# Patient Record
Sex: Male | Born: 1980 | Race: White | Hispanic: No | Marital: Married | Smoking: Never smoker
Health system: Southern US, Community
[De-identification: ages and names within clinical notes are randomized; demographics above are authoritative.]

## PROBLEM LIST (undated history)

## (undated) DIAGNOSIS — I10 Essential (primary) hypertension: Secondary | ICD-10-CM

## (undated) DIAGNOSIS — M62830 Muscle spasm of back: Secondary | ICD-10-CM

## (undated) DIAGNOSIS — M5136 Other intervertebral disc degeneration, lumbar region: Secondary | ICD-10-CM

## (undated) DIAGNOSIS — M7918 Myalgia, other site: Secondary | ICD-10-CM

## (undated) DIAGNOSIS — M48061 Spinal stenosis, lumbar region without neurogenic claudication: Secondary | ICD-10-CM

## (undated) DIAGNOSIS — M51369 Other intervertebral disc degeneration, lumbar region without mention of lumbar back pain or lower extremity pain: Secondary | ICD-10-CM

## (undated) DIAGNOSIS — K760 Fatty (change of) liver, not elsewhere classified: Secondary | ICD-10-CM

## (undated) DIAGNOSIS — M545 Low back pain, unspecified: Secondary | ICD-10-CM

## (undated) DIAGNOSIS — R7303 Prediabetes: Secondary | ICD-10-CM

## (undated) DIAGNOSIS — M4310 Spondylolisthesis, site unspecified: Secondary | ICD-10-CM

## (undated) DIAGNOSIS — M792 Neuralgia and neuritis, unspecified: Secondary | ICD-10-CM

## (undated) HISTORY — DX: Other intervertebral disc degeneration, lumbar region without mention of lumbar back pain or lower extremity pain: M51.369

## (undated) HISTORY — DX: Muscle spasm of back: M62.830

## (undated) HISTORY — DX: Low back pain, unspecified: M54.50

## (undated) HISTORY — PX: BACK SURGERY: SHX140

## (undated) HISTORY — DX: Spondylolisthesis, site unspecified: M43.10

## (undated) HISTORY — DX: Spinal stenosis, lumbar region without neurogenic claudication: M48.061

## (undated) HISTORY — DX: Neuralgia and neuritis, unspecified: M79.2

## (undated) HISTORY — DX: Myalgia, other site: M79.18

## (undated) HISTORY — DX: Other intervertebral disc degeneration, lumbar region: M51.36

---

## 2016-03-04 DIAGNOSIS — H66009 Acute suppurative otitis media without spontaneous rupture of ear drum, unspecified ear: Secondary | ICD-10-CM | POA: Diagnosis not present

## 2016-12-03 DIAGNOSIS — H60539 Acute contact otitis externa, unspecified ear: Secondary | ICD-10-CM | POA: Diagnosis not present

## 2016-12-03 DIAGNOSIS — H6123 Impacted cerumen, bilateral: Secondary | ICD-10-CM | POA: Diagnosis not present

## 2016-12-03 DIAGNOSIS — J209 Acute bronchitis, unspecified: Secondary | ICD-10-CM | POA: Diagnosis not present

## 2017-11-11 DIAGNOSIS — M25512 Pain in left shoulder: Secondary | ICD-10-CM | POA: Diagnosis not present

## 2017-11-24 DIAGNOSIS — M25512 Pain in left shoulder: Secondary | ICD-10-CM | POA: Diagnosis not present

## 2017-11-24 DIAGNOSIS — Z6827 Body mass index (BMI) 27.0-27.9, adult: Secondary | ICD-10-CM | POA: Diagnosis not present

## 2017-11-24 DIAGNOSIS — L259 Unspecified contact dermatitis, unspecified cause: Secondary | ICD-10-CM | POA: Diagnosis not present

## 2017-11-24 DIAGNOSIS — Z1339 Encounter for screening examination for other mental health and behavioral disorders: Secondary | ICD-10-CM | POA: Diagnosis not present

## 2017-11-24 DIAGNOSIS — Z1331 Encounter for screening for depression: Secondary | ICD-10-CM | POA: Diagnosis not present

## 2018-03-20 DIAGNOSIS — Z302 Encounter for sterilization: Secondary | ICD-10-CM | POA: Diagnosis not present

## 2018-07-14 DIAGNOSIS — K760 Fatty (change of) liver, not elsewhere classified: Secondary | ICD-10-CM | POA: Diagnosis not present

## 2018-07-14 DIAGNOSIS — Z6828 Body mass index (BMI) 28.0-28.9, adult: Secondary | ICD-10-CM | POA: Diagnosis not present

## 2018-07-14 DIAGNOSIS — Z23 Encounter for immunization: Secondary | ICD-10-CM | POA: Diagnosis not present

## 2018-08-12 DIAGNOSIS — R933 Abnormal findings on diagnostic imaging of other parts of digestive tract: Secondary | ICD-10-CM | POA: Diagnosis not present

## 2018-08-14 DIAGNOSIS — K824 Cholesterolosis of gallbladder: Secondary | ICD-10-CM | POA: Diagnosis not present

## 2018-08-14 DIAGNOSIS — R933 Abnormal findings on diagnostic imaging of other parts of digestive tract: Secondary | ICD-10-CM | POA: Diagnosis not present

## 2018-08-14 DIAGNOSIS — K760 Fatty (change of) liver, not elsewhere classified: Secondary | ICD-10-CM | POA: Diagnosis not present

## 2018-08-14 DIAGNOSIS — R945 Abnormal results of liver function studies: Secondary | ICD-10-CM | POA: Diagnosis not present

## 2018-08-14 DIAGNOSIS — R935 Abnormal findings on diagnostic imaging of other abdominal regions, including retroperitoneum: Secondary | ICD-10-CM | POA: Diagnosis not present

## 2018-09-29 DIAGNOSIS — Z0000 Encounter for general adult medical examination without abnormal findings: Secondary | ICD-10-CM | POA: Diagnosis not present

## 2018-09-29 DIAGNOSIS — Z6828 Body mass index (BMI) 28.0-28.9, adult: Secondary | ICD-10-CM | POA: Diagnosis not present

## 2018-10-03 DIAGNOSIS — R51 Headache: Secondary | ICD-10-CM | POA: Diagnosis not present

## 2018-10-03 DIAGNOSIS — H81399 Other peripheral vertigo, unspecified ear: Secondary | ICD-10-CM | POA: Diagnosis not present

## 2018-10-05 DIAGNOSIS — Z0189 Encounter for other specified special examinations: Secondary | ICD-10-CM | POA: Diagnosis not present

## 2018-10-05 DIAGNOSIS — I10 Essential (primary) hypertension: Secondary | ICD-10-CM | POA: Diagnosis not present

## 2018-10-05 DIAGNOSIS — Z13220 Encounter for screening for lipoid disorders: Secondary | ICD-10-CM | POA: Diagnosis not present

## 2019-01-30 DIAGNOSIS — E860 Dehydration: Secondary | ICD-10-CM | POA: Diagnosis not present

## 2019-01-30 DIAGNOSIS — K529 Noninfective gastroenteritis and colitis, unspecified: Secondary | ICD-10-CM | POA: Diagnosis not present

## 2019-02-01 DIAGNOSIS — K529 Noninfective gastroenteritis and colitis, unspecified: Secondary | ICD-10-CM | POA: Diagnosis not present

## 2019-02-01 DIAGNOSIS — Z6825 Body mass index (BMI) 25.0-25.9, adult: Secondary | ICD-10-CM | POA: Diagnosis not present

## 2019-04-27 DIAGNOSIS — J40 Bronchitis, not specified as acute or chronic: Secondary | ICD-10-CM | POA: Diagnosis not present

## 2019-04-27 DIAGNOSIS — J329 Chronic sinusitis, unspecified: Secondary | ICD-10-CM | POA: Diagnosis not present

## 2019-09-18 DIAGNOSIS — Z20828 Contact with and (suspected) exposure to other viral communicable diseases: Secondary | ICD-10-CM | POA: Diagnosis not present

## 2019-09-30 DIAGNOSIS — Z23 Encounter for immunization: Secondary | ICD-10-CM | POA: Diagnosis not present

## 2019-09-30 DIAGNOSIS — Z1321 Encounter for screening for nutritional disorder: Secondary | ICD-10-CM | POA: Diagnosis not present

## 2019-09-30 DIAGNOSIS — K219 Gastro-esophageal reflux disease without esophagitis: Secondary | ICD-10-CM | POA: Diagnosis not present

## 2019-09-30 DIAGNOSIS — M545 Low back pain: Secondary | ICD-10-CM | POA: Diagnosis not present

## 2019-09-30 DIAGNOSIS — R030 Elevated blood-pressure reading, without diagnosis of hypertension: Secondary | ICD-10-CM | POA: Diagnosis not present

## 2019-12-24 DIAGNOSIS — Z23 Encounter for immunization: Secondary | ICD-10-CM | POA: Diagnosis not present

## 2020-04-06 DIAGNOSIS — M5136 Other intervertebral disc degeneration, lumbar region: Secondary | ICD-10-CM | POA: Diagnosis not present

## 2020-04-12 DIAGNOSIS — Z5181 Encounter for therapeutic drug level monitoring: Secondary | ICD-10-CM | POA: Diagnosis not present

## 2020-11-21 DIAGNOSIS — S63502A Unspecified sprain of left wrist, initial encounter: Secondary | ICD-10-CM | POA: Diagnosis not present

## 2021-01-30 DIAGNOSIS — M79642 Pain in left hand: Secondary | ICD-10-CM | POA: Diagnosis not present

## 2021-01-30 DIAGNOSIS — M654 Radial styloid tenosynovitis [de Quervain]: Secondary | ICD-10-CM | POA: Diagnosis not present

## 2021-01-30 DIAGNOSIS — M778 Other enthesopathies, not elsewhere classified: Secondary | ICD-10-CM | POA: Diagnosis not present

## 2021-02-05 DIAGNOSIS — M545 Low back pain, unspecified: Secondary | ICD-10-CM | POA: Diagnosis not present

## 2021-02-05 DIAGNOSIS — R739 Hyperglycemia, unspecified: Secondary | ICD-10-CM | POA: Diagnosis not present

## 2021-02-05 DIAGNOSIS — G8929 Other chronic pain: Secondary | ICD-10-CM | POA: Diagnosis not present

## 2021-02-05 DIAGNOSIS — I10 Essential (primary) hypertension: Secondary | ICD-10-CM | POA: Diagnosis not present

## 2021-02-05 DIAGNOSIS — Z01810 Encounter for preprocedural cardiovascular examination: Secondary | ICD-10-CM | POA: Diagnosis not present

## 2021-02-15 DIAGNOSIS — I10 Essential (primary) hypertension: Secondary | ICD-10-CM | POA: Diagnosis not present

## 2021-03-01 DIAGNOSIS — S161XXA Strain of muscle, fascia and tendon at neck level, initial encounter: Secondary | ICD-10-CM | POA: Diagnosis not present

## 2021-04-11 DIAGNOSIS — K760 Fatty (change of) liver, not elsewhere classified: Secondary | ICD-10-CM | POA: Diagnosis not present

## 2021-04-11 DIAGNOSIS — E119 Type 2 diabetes mellitus without complications: Secondary | ICD-10-CM | POA: Diagnosis not present

## 2021-05-23 DIAGNOSIS — E785 Hyperlipidemia, unspecified: Secondary | ICD-10-CM | POA: Diagnosis not present

## 2021-05-25 ENCOUNTER — Ambulatory Visit: Payer: Self-pay | Admitting: Orthopedic Surgery

## 2021-05-28 ENCOUNTER — Other Ambulatory Visit: Payer: Self-pay

## 2021-06-11 NOTE — Progress Notes (Signed)
[facility] [place]  ASSESSMENT / PLAN: Andres West is a 40 y.o. male with plans to undergo L5/S1 anterior lumbar interbody fusion on 06/21/21 with Dr. Shon Baton. I have reviewed the patient's imaging and feel it is safe to approach this anteriorly. I discussed the specific risks associated with spine exposure including vascular injury, urethral injury, bowel injury, nervous injury. I explained that if severe vascular injury was encountered we may need to abandon the spinal procedure to safely repair the injury. I explained the typical postoperative course for anterior exposure of the spine, including small risk of postoperative ileus. The patient was understanding and wished to proceed.   CHIEF COMPLAINT: back pain  HISTORY OF PRESENT ILLNESS: Andres West is a 40 y.o. male who is otherwise healthy who presents for preoperative evaluation for ALIF.  He reports significant back pain which is caused him that he over the past several months.  He was seen by Dr. Shon Baton and offered L5-S1 ALIF.  The patient has no past surgical history.  He has never had a significant injury to his abdomen or pelvis.  He has never had radiotherapy to his abdomen or pelvis.  Past Medical History:  Diagnosis Date   Back spasm    Buttock pain    Degenerative spondylolisthesis    Foraminal stenosis of lumbar region    Moderate   Low back pain    Neuropathic pain, leg    Intermittent   Other intervertebral disc degeneration, lumbar region     Past Surgical History:  Procedure Laterality Date   BACK SURGERY N/A     History reviewed. No pertinent family history.  Social History   Socioeconomic History   Marital status: Married    Spouse name: Not on file   Number of children: Not on file   Years of education: Not on file   Highest education level: Not on file  Occupational History   Not on file  Tobacco Use   Smoking status: Never   Smokeless tobacco: Never  Vaping Use   Vaping  Use: Never used  Substance and Sexual Activity   Alcohol use: Not Currently   Drug use: Never   Sexual activity: Not on file  Other Topics Concern   Not on file  Social History Narrative   Not on file   Social Determinants of Health   Financial Resource Strain: Not on file  Food Insecurity: Not on file  Transportation Needs: Not on file  Physical Activity: Not on file  Stress: Not on file  Social Connections: Not on file  Intimate Partner Violence: Not on file    No Known Allergies  Current Outpatient Medications  Medication Sig Dispense Refill   lisinopril (ZESTRIL) 10 MG tablet Take 10 mg by mouth daily.     OMEPRAZOLE PO Take 1 capsule by mouth daily.     atorvastatin (LIPITOR) 40 MG tablet TAKE ONE TABLET BY MOUTH AT BEDTIME FOR CHOLESTEROL (Patient not taking: Reported on 06/12/2021)     baclofen (LIORESAL) 10 MG tablet TAKE ONE-HALF TABLET BY MOUTH TWICE A DAY AS NEEDED (Patient not taking: Reported on 06/12/2021)     No current facility-administered medications for this visit.    REVIEW OF SYSTEMS:  [X]  denotes positive finding, [ ]  denotes negative finding Cardiac  Comments:  Chest pain or chest pressure:    Shortness of breath upon exertion:    Short of breath when lying flat:    Irregular heart rhythm:  Vascular    Pain in calf, thigh, or hip brought on by ambulation:    Pain in feet at night that wakes you up from your sleep:     Blood clot in your veins:    Leg swelling:         Pulmonary    Oxygen at home:    Productive cough:     Wheezing:         Neurologic    Sudden weakness in arms or legs:     Sudden numbness in arms or legs:     Sudden onset of difficulty speaking or slurred speech:    Temporary loss of vision in one eye:     Problems with dizziness:         Gastrointestinal    Blood in stool:     Vomited blood:         Genitourinary    Burning when urinating:     Blood in urine:        Psychiatric    Major depression:          Hematologic    Bleeding problems:    Problems with blood clotting too easily:        Skin    Rashes or ulcers:        Constitutional    Fever or chills:      PHYSICAL EXAM Vitals:   06/12/21 1619  BP: 124/83  Pulse: 100  Resp: 20  Temp: 98.6 F (37 C)  SpO2: 97%  Weight: 206 lb 12.8 oz (93.8 kg)  Height: 6' (1.829 m)    Constitutional: well appearing. no distress. Appears well nourished.  Neurologic: CN intact. no focal findings. no sensory loss. Psychiatric:  Mood and affect symmetric and appropriate. Eyes:  No icterus. No conjunctival pallor. Ears, nose, throat:  mucous membranes moist. Midline trachea.  Cardiac:  rate and rhythm.  Respiratory:  unlabored. Abdominal:  soft, non-tender, non-distended.  Extremity: no edema. no cyanosis. no pallor.  Skin: no gangrene. no ulceration.  Lymphatic: no Stemmer's sign. no palpable lymphadenopathy.  PERTINENT LABORATORY AND RADIOLOGIC DATA MRI lumbar spine personally reviewed.  Venous structures appear to bifurcate around the top of the L5 vertebral body.  No significant vascular structures overlying the L5/S1 disc space.  Rande Brunt. Lenell Antu, MD [facility] Phone Number: [phone] 06/12/2021 5:35 PM  Total time spent on preparing this encounter including chart review, data review, collecting history, examining the patient, coordinating care for this new patient, 45 minutes.  Portions of this report may have been transcribed using voice recognition software.  Every effort has been made to ensure accuracy; however, inadvertent computerized transcription errors may still be present.

## 2021-06-11 NOTE — H&P (View-Only) (Signed)
[facility] [place]  ASSESSMENT / PLAN: Andres West is a 40 y.o. male with plans to undergo L5/S1 anterior lumbar interbody fusion on 06/21/21 with Dr. Shon Baton. I have reviewed the patient's imaging and feel it is safe to approach this anteriorly. I discussed the specific risks associated with spine exposure including vascular injury, urethral injury, bowel injury, nervous injury. I explained that if severe vascular injury was encountered we may need to abandon the spinal procedure to safely repair the injury. I explained the typical postoperative course for anterior exposure of the spine, including small risk of postoperative ileus. The patient was understanding and wished to proceed.   CHIEF COMPLAINT: back pain  HISTORY OF PRESENT ILLNESS: Andres West is a 40 y.o. male who is otherwise healthy who presents for preoperative evaluation for ALIF.  He reports significant back pain which is caused him that he over the past several months.  He was seen by Dr. Shon Baton and offered L5-S1 ALIF.  The patient has no past surgical history.  He has never had a significant injury to his abdomen or pelvis.  He has never had radiotherapy to his abdomen or pelvis.  Past Medical History:  Diagnosis Date   Back spasm    Buttock pain    Degenerative spondylolisthesis    Foraminal stenosis of lumbar region    Moderate   Low back pain    Neuropathic pain, leg    Intermittent   Other intervertebral disc degeneration, lumbar region     Past Surgical History:  Procedure Laterality Date   BACK SURGERY N/A     History reviewed. No pertinent family history.  Social History   Socioeconomic History   Marital status: Married    Spouse name: Not on file   Number of children: Not on file   Years of education: Not on file   Highest education level: Not on file  Occupational History   Not on file  Tobacco Use   Smoking status: Never   Smokeless tobacco: Never  Vaping Use   Vaping  Use: Never used  Substance and Sexual Activity   Alcohol use: Not Currently   Drug use: Never   Sexual activity: Not on file  Other Topics Concern   Not on file  Social History Narrative   Not on file   Social Determinants of Health   Financial Resource Strain: Not on file  Food Insecurity: Not on file  Transportation Needs: Not on file  Physical Activity: Not on file  Stress: Not on file  Social Connections: Not on file  Intimate Partner Violence: Not on file    No Known Allergies  Current Outpatient Medications  Medication Sig Dispense Refill   lisinopril (ZESTRIL) 10 MG tablet Take 10 mg by mouth daily.     OMEPRAZOLE PO Take 1 capsule by mouth daily.     atorvastatin (LIPITOR) 40 MG tablet TAKE ONE TABLET BY MOUTH AT BEDTIME FOR CHOLESTEROL (Patient not taking: Reported on 06/12/2021)     baclofen (LIORESAL) 10 MG tablet TAKE ONE-HALF TABLET BY MOUTH TWICE A DAY AS NEEDED (Patient not taking: Reported on 06/12/2021)     No current facility-administered medications for this visit.    REVIEW OF SYSTEMS:  [X]  denotes positive finding, [ ]  denotes negative finding Cardiac  Comments:  Chest pain or chest pressure:    Shortness of breath upon exertion:    Short of breath when lying flat:    Irregular heart rhythm:  Vascular    Pain in calf, thigh, or hip brought on by ambulation:    Pain in feet at night that wakes you up from your sleep:     Blood clot in your veins:    Leg swelling:         Pulmonary    Oxygen at home:    Productive cough:     Wheezing:         Neurologic    Sudden weakness in arms or legs:     Sudden numbness in arms or legs:     Sudden onset of difficulty speaking or slurred speech:    Temporary loss of vision in one eye:     Problems with dizziness:         Gastrointestinal    Blood in stool:     Vomited blood:         Genitourinary    Burning when urinating:     Blood in urine:        Psychiatric    Major depression:          Hematologic    Bleeding problems:    Problems with blood clotting too easily:        Skin    Rashes or ulcers:        Constitutional    Fever or chills:      PHYSICAL EXAM Vitals:   06/12/21 1619  BP: 124/83  Pulse: 100  Resp: 20  Temp: 98.6 F (37 C)  SpO2: 97%  Weight: 206 lb 12.8 oz (93.8 kg)  Height: 6' (1.829 m)    Constitutional: well appearing. no distress. Appears well nourished.  Neurologic: CN intact. no focal findings. no sensory loss. Psychiatric:  Mood and affect symmetric and appropriate. Eyes:  No icterus. No conjunctival pallor. Ears, nose, throat:  mucous membranes moist. Midline trachea.  Cardiac:  rate and rhythm.  Respiratory:  unlabored. Abdominal:  soft, non-tender, non-distended.  Extremity: no edema. no cyanosis. no pallor.  Skin: no gangrene. no ulceration.  Lymphatic: no Stemmer's sign. no palpable lymphadenopathy.  PERTINENT LABORATORY AND RADIOLOGIC DATA MRI lumbar spine personally reviewed.  Venous structures appear to bifurcate around the top of the L5 vertebral body.  No significant vascular structures overlying the L5/S1 disc space.  Mohmmad Saleeby N. Alda Gaultney, MD [facility] [place] Office Phone Number: [phone] 06/12/2021 5:35 PM  Total time spent on preparing this encounter including chart review, data review, collecting history, examining the patient, coordinating care for this new patient, 45 minutes.  Portions of this report may have been transcribed using voice recognition software.  Every effort has been made to ensure accuracy; however, inadvertent computerized transcription errors may still be present.    

## 2021-06-12 ENCOUNTER — Encounter: Payer: Self-pay | Admitting: Vascular Surgery

## 2021-06-12 ENCOUNTER — Ambulatory Visit (INDEPENDENT_AMBULATORY_CARE_PROVIDER_SITE_OTHER): Payer: No Typology Code available for payment source | Admitting: Vascular Surgery

## 2021-06-12 ENCOUNTER — Ambulatory Visit: Payer: Self-pay | Admitting: Orthopedic Surgery

## 2021-06-12 ENCOUNTER — Other Ambulatory Visit: Payer: Self-pay

## 2021-06-12 VITALS — BP 124/83 | HR 100 | Temp 98.6°F | Resp 20 | Ht 72.0 in | Wt 206.8 lb

## 2021-06-12 DIAGNOSIS — M545 Low back pain, unspecified: Secondary | ICD-10-CM | POA: Diagnosis not present

## 2021-06-12 NOTE — H&P (Signed)
Subjective:   Andres West is a very pleasant 40 year old gentleman with significant progressive back buttock and neuropathic leg pain right worse than left. He has had to full rounds of physical therapy as well as facet blocks and medial branch blocks without any significant relief. We have discussed surgical intervention, risks and benefits and alternatives. Patient has elected to move forward with surgical intervention. He is scheduled for ALIF L5-S1 w/ PSFI on 06/21/2021 with Dr. Shon Baton at [facility].  Past Medical History:  Diagnosis Date   Back spasm    Buttock pain    Degenerative spondylolisthesis    Foraminal stenosis of lumbar region    Moderate   Low back pain    Neuropathic pain, leg    Intermittent   Other intervertebral disc degeneration, lumbar region      Current Outpatient Medications  Medication Sig Dispense Refill Last Dose   lisinopril (ZESTRIL) 10 MG tablet Take 10 mg by mouth daily.      OMEPRAZOLE PO Take 1 capsule by mouth daily.      No current facility-administered medications for this visit.   No Known Allergies  Social History   Tobacco Use   Smoking status: Not on file   Smokeless tobacco: Not on file  Substance Use Topics   Alcohol use: Not on file    No family history on file.  Review of Systems Pertinent items are noted in HPI.  Objective:   Vitals: Ht: 6 ft 06/12/2021 01:23 pm Wt: 204.5 lbs 06/12/2021 01:23 pm BMI: 27.7 06/12/2021 01:23 pm BP: 118/88 06/12/2021 01:25 pm O2Sat: 99% 06/12/2021 01:25 pm  General: Alert and oriented 3, no apparent distress  Ambulation normal. No assistive devices  Heart: Regular rate and rhythm, no rubs, murmurs, or gallops  Lungs clear to auscultation bilaterally  Abdomen: Bowel sounds 4. Nondistended, nontender, no rebound tenderness. No loss of bladder or bowel control.  He continues to have severe back buttock and intermittent neuropathic leg pain. There is been no change in his clinical exam since  his last office visit of 01/19/21. Lumbar MRI: completed on 02/14/21 was reviewed with the patient. It was completed at [facility]; I have independently reviewed the images as well as the radiology report. L5-S1 retrolisthesis. Severe bilateral L5 and S1 nerve root compression. Moderate foraminal stenosis. Advanced degenerative disc disease noted at this level. Mild to minimal disc degeneration L1-5. No abnormal fracture or suspicious marrow signal changes. There is a rudimentary disc noted as S1-S2.  Assessment:   [name] is a very pleasant 40 year old gentleman with significant progressive back buttock and neuropathic leg pain. At this point time the MRI shows no significant change and so I think it is reasonable to move forward with the planned anterior lumbar interbody fusion L5-S1 with posterior pedicle screw fixation. I have gone over the surgical procedure as well as the images with the patient.  Risks and benefits of surgery were discussed with the patient. These include: Infection, bleeding, death, stroke, paralysis, ongoing or worse pain, need for additional surgery, nonunion, leak of spinal fluid, adjacent segment degeneration requiring additional fusion surgery, need for posterior decompression and/or fusion. Bleeding from major vessels, and blood clots (deep venous thrombosis)requiring additional treatment. Due to the abdominal contents requiring further intervention, loss in bowel and bladder control. Additional risk for male patients: Retrograde ejaculation, and therefore infertility.  Risks and benefits of surgery were discussed with the patient. These include: Infection, bleeding, death, stroke, paralysis, ongoing or worse pain, need for additional surgery, nonunion,  leak of spinal fluid, adjacent segment degeneration requiring additional fusion surgery, Injury to abdominal vessels that can require anterior surgery to stop bleeding. Malposition of the cage and/or pedicle screws that could  require additional surgery. Loss of bowel and bladder control. Postoperative hematoma causing neurologic compression that could require urgent or emergent re-operation.    Plan:   Anterior lumbar interbody fusion at L5-S1 posterior spinal fusion instrumentation  We will obtain preoperative medical clearance for the patient's primary care provider  Patient is scheduled to meet with vascular surgeon tomorrow  Patient scheduled for LSO brace fitting  Patient scheduled for preop testing at [facility]  We have also discussed the post-operative recovery period to include: bathing/showering restrictions, wound healing, activity (and driving) restrictions, medications/pain mangement.  We have also discussed post-operative redflags to include: signs and symptoms of postoperative infection, DVT/PE.  Patient given a copy of the discharge instructions at today's office visit. These were reviewed with him. All of his questions were invited and answered  Follow-up: 2 weeks postop

## 2021-06-12 NOTE — Pre-Procedure Instructions (Signed)
Surgical Instructions   Your procedure is scheduled on Thursday, September 8th. Report to [facility] Main Entrance "A" at 05:30 A.M., then check in with the Admitting office. Call this number if you have problems the morning of surgery: [phone]   If you have any questions prior to your surgery date call [phone]: Open Monday-Friday 8am-4pm   Remember: Do not eat after midnight the night before your surgery  You may drink clear liquids until 04:30 AM the morning of your surgery.   Clear liquids allowed are: Water, Non-Citrus Juices (without pulp), Carbonated Beverages, Clear Tea, Black Coffee ONLY (NO MILK, CREAM OR POWDERED CREAMER of any kind), and Gatorade   Take these medicines the morning of surgery with A SIP OF WATER  OMEPRAZOLE    As of today, STOP taking any Aspirin (unless otherwise instructed by your surgeon) Aleve, Naproxen, Ibuprofen, Motrin, Advil, Goody's, BC's, all herbal medications, fish oil, and all vitamins.          Do not wear jewelry  Do not wear lotions, powders, colognes, or deodorant. Men may shave face and neck. Do not bring valuables to the hospital. [facility] is not responsible for any belongings or valuables.               Do NOT Smoke (Tobacco/Vaping) or drink Alcohol 24 hours prior to your procedure If you use a CPAP at night, you may bring all equipment for your overnight stay.   Contacts, glasses, dentures or bridgework may not be worn into surgery, please bring cases for these belongings   For patients admitted to the hospital, discharge time will be determined by your treatment team.   Patients discharged the day of surgery will not be allowed to drive home, and someone needs to stay with them for 24 hours.  ONLY 1 SUPPORT PERSON MAY BE PRESENT WHILE YOU ARE IN SURGERY. IF YOU ARE TO BE ADMITTED ONCE YOU ARE IN YOUR ROOM YOU WILL BE ALLOWED TWO (2) VISITORS.  Minor children may have two parents present. Special consideration for  safety and communication needs will be reviewed on a case by case basis.  Special instructions:    Oral Hygiene is also important to reduce your risk of infection.  Remember - BRUSH YOUR TEETH THE MORNING OF SURGERY WITH YOUR REGULAR TOOTHPASTE   [place]- Preparing For Surgery  Before surgery, you can play an important role. Because skin is not sterile, your skin needs to be as free of germs as possible. You can reduce the number of germs on your skin by washing with CHG (chlorahexidine gluconate) Soap before surgery.  CHG is an antiseptic cleaner which kills germs and bonds with the skin to continue killing germs even after washing.     Please do not use if you have an allergy to CHG or antibacterial soaps. If your skin becomes reddened/irritated stop using the CHG.  Do not shave (including legs and underarms) for at least 48 hours prior to first CHG shower. It is OK to shave your face.  Please follow these instructions carefully.     Shower the NIGHT BEFORE SURGERY and the MORNING OF SURGERY with CHG Soap.   If you chose to wash your hair, wash your hair first as usual with your normal shampoo. After you shampoo, rinse your hair and body thoroughly to remove the shampoo.  Then [employer] and genitals (private parts) with your normal soap and rinse thoroughly to remove soap.  After that Use CHG  Soap as you would any other liquid soap. You can apply CHG directly to the skin and wash gently with a scrungie or a clean washcloth.   Apply the CHG Soap to your body ONLY FROM THE NECK DOWN.  Do not use on open wounds or open sores. Avoid contact with your eyes, ears, mouth and genitals (private parts). Wash Face and genitals (private parts)  with your normal soap.   Wash thoroughly, paying special attention to the area where your surgery will be performed.  Thoroughly rinse your body with warm water from the neck down.  DO NOT shower/wash with your normal soap after using and rinsing off  the CHG Soap.  Pat yourself dry with a CLEAN TOWEL.  Wear CLEAN PAJAMAS to bed the night before surgery  Place CLEAN SHEETS on your bed the night before your surgery  DO NOT SLEEP WITH PETS.   Day of Surgery:  Take a shower with CHG soap. Wear Clean/Comfortable clothing the morning of surgery Do not apply any deodorants/lotions.   Remember to brush your teeth WITH YOUR REGULAR TOOTHPASTE.   Please read over the following fact sheets that you were given.

## 2021-06-13 ENCOUNTER — Other Ambulatory Visit: Payer: Self-pay

## 2021-06-13 ENCOUNTER — Encounter (HOSPITAL_COMMUNITY): Payer: Self-pay

## 2021-06-13 ENCOUNTER — Encounter (HOSPITAL_COMMUNITY)
Admission: RE | Admit: 2021-06-13 | Discharge: 2021-06-13 | Disposition: A | Discharge status: Home / Self Care | Payer: No Typology Code available for payment source | Source: Ambulatory Visit | Attending: Orthopedic Surgery | Admitting: Orthopedic Surgery

## 2021-06-13 DIAGNOSIS — Z01818 Encounter for other preprocedural examination: Secondary | ICD-10-CM | POA: Diagnosis present

## 2021-06-13 HISTORY — DX: Essential (primary) hypertension: I10

## 2021-06-13 HISTORY — DX: Prediabetes: R73.03

## 2021-06-13 HISTORY — DX: Fatty (change of) liver, not elsewhere classified: K76.0

## 2021-06-13 LAB — CBC
HCT: 47.6 % (ref 39.0–52.0)
Hemoglobin: 16.1 g/dL (ref 13.0–17.0)
MCH: 30.3 pg (ref 26.0–34.0)
MCHC: 33.8 g/dL (ref 30.0–36.0)
MCV: 89.5 fL (ref 80.0–100.0)
Platelets: 153 10*3/uL (ref 150–400)
RBC: 5.32 MIL/uL (ref 4.22–5.81)
RDW: 12.2 % (ref 11.5–15.5)
WBC: 5.9 10*3/uL (ref 4.0–10.5)
nRBC: 0 % (ref 0.0–0.2)

## 2021-06-13 LAB — URINALYSIS, ROUTINE W REFLEX MICROSCOPIC
Bacteria, UA: NONE SEEN
Bilirubin Urine: NEGATIVE
Glucose, UA: NEGATIVE mg/dL
Ketones, ur: NEGATIVE mg/dL
Leukocytes,Ua: NEGATIVE
Nitrite: NEGATIVE
Protein, ur: NEGATIVE mg/dL
Specific Gravity, Urine: 1.006 (ref 1.005–1.030)
pH: 6 (ref 5.0–8.0)

## 2021-06-13 LAB — BASIC METABOLIC PANEL
Anion gap: 7 (ref 5–15)
BUN: 12 mg/dL (ref 6–20)
CO2: 28 mmol/L (ref 22–32)
Calcium: 9.5 mg/dL (ref 8.9–10.3)
Chloride: 101 mmol/L (ref 98–111)
Creatinine, Ser: 1.1 mg/dL (ref 0.61–1.24)
GFR, Estimated: >60 mL/min (ref >60)
Glucose, Bld: 110 mg/dL — ABNORMAL HIGH (ref 70–99)
Potassium: 3.9 mmol/L (ref 3.5–5.1)
Sodium: 136 mmol/L (ref 135–145)

## 2021-06-13 LAB — PROTIME-INR
INR: 1 (ref 0.8–1.2)
Prothrombin Time: 13.6 seconds (ref 11.4–15.2)

## 2021-06-13 LAB — TYPE AND SCREEN
ABO/RH(D): A POS
Antibody Screen: NEGATIVE

## 2021-06-13 LAB — GLUCOSE, CAPILLARY: Glucose-Capillary: 120 mg/dL — ABNORMAL HIGH (ref 70–99)

## 2021-06-13 LAB — APTT: aPTT: 29 seconds (ref 24–36)

## 2021-06-13 LAB — SURGICAL PCR SCREEN
MRSA, PCR: NEGATIVE
Staphylococcus aureus: NEGATIVE

## 2021-06-13 NOTE — Progress Notes (Signed)
[facility] - [facility], Dr- Su Hilt Cardiologist - denies  PPM/ICD - denies   Chest x-ray -  EKG - 06/13/21 Stress Test - denies ECHO - denies Cardiac Cath - denies  Sleep Study - denies   Does not check CBG at home  Patient instructed to hold all Aspirin, NSAID's, herbal medications, fish oil and vitamins 7 days prior to surgery.   ERAS Protcol -yes   COVID TEST- scheduled for 06/19/21  Anesthesia review: per order  Patient denies shortness of breath, fever, cough and chest pain at PAT appointment   All instructions explained to the patient, with a verbal understanding of the material. Patient agrees to go over the instructions while at home for a better understanding. Patient also instructed to self quarantine after being tested for COVID-19. The opportunity to ask questions was provided.

## 2021-06-19 ENCOUNTER — Other Ambulatory Visit: Payer: Self-pay

## 2021-06-19 ENCOUNTER — Other Ambulatory Visit (HOSPITAL_COMMUNITY)
Admission: RE | Admit: 2021-06-19 | Discharge: 2021-06-19 | Disposition: A | Discharge status: Home / Self Care | Payer: No Typology Code available for payment source | Source: Ambulatory Visit | Attending: Orthopedic Surgery | Admitting: Orthopedic Surgery

## 2021-06-19 DIAGNOSIS — Z20822 Contact with and (suspected) exposure to covid-19: Secondary | ICD-10-CM | POA: Insufficient documentation

## 2021-06-19 DIAGNOSIS — Z01812 Encounter for preprocedural laboratory examination: Secondary | ICD-10-CM | POA: Insufficient documentation

## 2021-06-20 LAB — SARS CORONAVIRUS 2 (TAT 6-24 HRS): SARS Coronavirus 2: NEGATIVE

## 2021-06-20 NOTE — Anesthesia Preprocedure Evaluation (Addendum)
Anesthesia Evaluation  Patient identified by MRN, date of birth, ID band Patient awake    Reviewed: Allergy & Precautions, NPO status , Patient's Chart, lab work & pertinent test results  Airway Mallampati: II  TM Distance: >3 FB Neck ROM: Full    Dental  (+) Dental Advisory Given, Chipped,    Pulmonary neg pulmonary ROS,    Pulmonary exam normal breath sounds clear to auscultation       Cardiovascular hypertension, Pt. on medications Normal cardiovascular exam Rhythm:Regular Rate:Normal     Neuro/Psych negative neurological ROS  negative psych ROS   GI/Hepatic Neg liver ROS, GERD  Medicated and Controlled,  Endo/Other  negative endocrine ROS  Renal/GU negative Renal ROS  negative genitourinary   Musculoskeletal  (+) Arthritis ,   Abdominal   Peds  Hematology negative hematology ROS (+)   Anesthesia Other Findings   Reproductive/Obstetrics                            Anesthesia Physical Anesthesia Plan  ASA: 2  Anesthesia Plan: General and Regional   Post-op Pain Management:  Regional for Post-op pain   Induction: Intravenous  PONV Risk Score and Plan: Midazolam, Dexamethasone and Ondansetron  Airway Management Planned: Oral ETT  Additional Equipment:   Intra-op Plan:   Post-operative Plan: Extubation in OR  Informed Consent: I have reviewed the patients History and Physical, chart, labs and discussed the procedure including the risks, benefits and alternatives for the proposed anesthesia with the patient or authorized representative who has indicated his/her understanding and acceptance.     Dental advisory given  Plan Discussed with: CRNA  Anesthesia Plan Comments: (2 IVs)        Anesthesia Quick Evaluation

## 2021-06-21 ENCOUNTER — Other Ambulatory Visit: Payer: Self-pay

## 2021-06-21 ENCOUNTER — Inpatient Hospital Stay (HOSPITAL_COMMUNITY): Payer: No Typology Code available for payment source

## 2021-06-21 ENCOUNTER — Encounter (HOSPITAL_COMMUNITY)
Admission: RE | Disposition: A | Discharge status: Home / Self Care | Payer: Self-pay | Source: Home / Self Care | Attending: Orthopedic Surgery

## 2021-06-21 ENCOUNTER — Inpatient Hospital Stay (HOSPITAL_COMMUNITY): Payer: No Typology Code available for payment source | Admitting: Certified Registered"

## 2021-06-21 ENCOUNTER — Encounter (HOSPITAL_COMMUNITY): Payer: Self-pay | Admitting: Orthopedic Surgery

## 2021-06-21 ENCOUNTER — Inpatient Hospital Stay (HOSPITAL_COMMUNITY)
Admission: RE | Admit: 2021-06-21 | Discharge: 2021-06-23 | DRG: 460 | Disposition: A | Discharge status: Home / Self Care | Payer: No Typology Code available for payment source | Attending: Orthopedic Surgery | Admitting: Orthopedic Surgery

## 2021-06-21 DIAGNOSIS — R7303 Prediabetes: Secondary | ICD-10-CM | POA: Diagnosis present

## 2021-06-21 DIAGNOSIS — Z79899 Other long term (current) drug therapy: Secondary | ICD-10-CM | POA: Diagnosis not present

## 2021-06-21 DIAGNOSIS — M5137 Other intervertebral disc degeneration, lumbosacral region: Secondary | ICD-10-CM | POA: Diagnosis present

## 2021-06-21 DIAGNOSIS — I10 Essential (primary) hypertension: Secondary | ICD-10-CM | POA: Diagnosis present

## 2021-06-21 DIAGNOSIS — M5117 Intervertebral disc disorders with radiculopathy, lumbosacral region: Principal | ICD-10-CM | POA: Diagnosis present

## 2021-06-21 DIAGNOSIS — Z20822 Contact with and (suspected) exposure to covid-19: Secondary | ICD-10-CM | POA: Diagnosis present

## 2021-06-21 DIAGNOSIS — K760 Fatty (change of) liver, not elsewhere classified: Secondary | ICD-10-CM | POA: Diagnosis present

## 2021-06-21 DIAGNOSIS — Z419 Encounter for procedure for purposes other than remedying health state, unspecified: Secondary | ICD-10-CM

## 2021-06-21 DIAGNOSIS — Z9889 Other specified postprocedural states: Secondary | ICD-10-CM | POA: Diagnosis not present

## 2021-06-21 DIAGNOSIS — Z981 Arthrodesis status: Secondary | ICD-10-CM

## 2021-06-21 HISTORY — PX: ANTERIOR AND POSTERIOR SPINAL FUSION: SHX2259

## 2021-06-21 HISTORY — PX: ABDOMINAL EXPOSURE: SHX5708

## 2021-06-21 LAB — CBC
HCT: 40.4 % (ref 39.0–52.0)
Hemoglobin: 14.3 g/dL (ref 13.0–17.0)
MCH: 31.2 pg (ref 26.0–34.0)
MCHC: 35.4 g/dL (ref 30.0–36.0)
MCV: 88 fL (ref 80.0–100.0)
Platelets: 115 10*3/uL — ABNORMAL LOW (ref 150–400)
RBC: 4.59 MIL/uL (ref 4.22–5.81)
RDW: 11.9 % (ref 11.5–15.5)
WBC: 11.7 10*3/uL — ABNORMAL HIGH (ref 4.0–10.5)
nRBC: 0 % (ref 0.0–0.2)

## 2021-06-21 LAB — ABO/RH: ABO/RH(D): A POS

## 2021-06-21 LAB — CREATININE, SERUM
Creatinine, Ser: 1.12 mg/dL (ref 0.61–1.24)
GFR, Estimated: >60 mL/min (ref >60)

## 2021-06-21 LAB — GLUCOSE, CAPILLARY: Glucose-Capillary: 123 mg/dL — ABNORMAL HIGH (ref 70–99)

## 2021-06-21 SURGERY — ANTERIOR AND POSTERIOR SPINAL FUSION
Anesthesia: Regional | Site: Back

## 2021-06-21 MED ORDER — DOCUSATE SODIUM 100 MG PO CAPS
100.0000 mg | ORAL_CAPSULE | Freq: Two times a day (BID) | ORAL | Status: DC
  Administered 2021-06-21 – 2021-06-23 (×3): 100 mg via ORAL
  Filled 2021-06-21 (×3): qty 1

## 2021-06-21 MED ORDER — ONDANSETRON HCL 4 MG/2ML IJ SOLN
4.0000 mg | Freq: Four times a day (QID) | INTRAMUSCULAR | Status: DC | PRN
  Administered 2021-06-21 (×2): 4 mg via INTRAVENOUS
  Filled 2021-06-21: qty 2

## 2021-06-21 MED ORDER — HEMOSTATIC AGENTS (NO CHARGE) OPTIME
TOPICAL | Status: DC | PRN
  Administered 2021-06-21: 1 via TOPICAL

## 2021-06-21 MED ORDER — LACTATED RINGERS IV SOLN: INTRAVENOUS | Status: DC

## 2021-06-21 MED ORDER — ACETAMINOPHEN 500 MG PO TABS
1000.0000 mg | ORAL_TABLET | Freq: Once | ORAL | Status: AC
  Administered 2021-06-21: 1000 mg via ORAL
  Filled 2021-06-21: qty 2

## 2021-06-21 MED ORDER — METHOCARBAMOL 500 MG PO TABS: 500.0000 mg | ORAL_TABLET | Freq: Three times a day (TID) | ORAL | 0 refills | Status: AC | PRN

## 2021-06-21 MED ORDER — THROMBIN 20000 UNITS EX SOLR
CUTANEOUS | Status: AC
  Filled 2021-06-21: qty 20000

## 2021-06-21 MED ORDER — ROCURONIUM BROMIDE 10 MG/ML (PF) SYRINGE
PREFILLED_SYRINGE | INTRAVENOUS | Status: DC | PRN
Start: 2021-06-21 — End: 2021-06-21
  Administered 2021-06-21 (×2): 10 mg via INTRAVENOUS
  Administered 2021-06-21: 50 mg via INTRAVENOUS
  Administered 2021-06-21 (×3): 10 mg via INTRAVENOUS

## 2021-06-21 MED ORDER — BUPIVACAINE-EPINEPHRINE 0.25% -1:200000 IJ SOLN
INTRAMUSCULAR | Status: DC | PRN
  Administered 2021-06-21: 20 mL

## 2021-06-21 MED ORDER — ACETAMINOPHEN 650 MG RE SUPP: 650.0000 mg | RECTAL | Status: DC | PRN

## 2021-06-21 MED ORDER — PROPOFOL 10 MG/ML IV BOLUS
INTRAVENOUS | Status: AC
  Filled 2021-06-21: qty 20

## 2021-06-21 MED ORDER — ONDANSETRON HCL 4 MG/2ML IJ SOLN
INTRAMUSCULAR | Status: DC | PRN
  Administered 2021-06-21: 4 mg via INTRAVENOUS

## 2021-06-21 MED ORDER — ONDANSETRON HCL 4 MG PO TABS
4.0000 mg | ORAL_TABLET | Freq: Four times a day (QID) | ORAL | Status: DC | PRN
  Filled 2021-06-21: qty 1

## 2021-06-21 MED ORDER — MIDAZOLAM HCL 2 MG/2ML IJ SOLN
INTRAMUSCULAR | Status: AC
  Filled 2021-06-21: qty 2

## 2021-06-21 MED ORDER — CEFAZOLIN SODIUM-DEXTROSE 1-4 GM/50ML-% IV SOLN
1.0000 g | Freq: Three times a day (TID) | INTRAVENOUS | Status: AC
  Administered 2021-06-21 – 2021-06-22 (×2): 1 g via INTRAVENOUS
  Filled 2021-06-21 (×2): qty 50

## 2021-06-21 MED ORDER — ROPIVACAINE HCL 5 MG/ML IJ SOLN
INTRAMUSCULAR | Status: DC | PRN
  Administered 2021-06-21: 30 mL via PERINEURAL

## 2021-06-21 MED ORDER — SUCCINYLCHOLINE CHLORIDE 200 MG/10ML IV SOSY
PREFILLED_SYRINGE | INTRAVENOUS | Status: AC
  Filled 2021-06-21: qty 10

## 2021-06-21 MED ORDER — KETAMINE HCL 50 MG/5ML IJ SOSY
PREFILLED_SYRINGE | INTRAMUSCULAR | Status: AC
  Filled 2021-06-21: qty 5

## 2021-06-21 MED ORDER — SUGAMMADEX SODIUM 200 MG/2ML IV SOLN
INTRAVENOUS | Status: DC | PRN
  Administered 2021-06-21: 100 mg via INTRAVENOUS

## 2021-06-21 MED ORDER — ORAL CARE MOUTH RINSE: 15.0000 mL | Freq: Once | OROMUCOSAL | Status: AC

## 2021-06-21 MED ORDER — OXYCODONE HCL 5 MG PO TABS: 5.0000 mg | ORAL_TABLET | Freq: Four times a day (QID) | ORAL | 0 refills | Status: AC | PRN

## 2021-06-21 MED ORDER — ENOXAPARIN SODIUM 40 MG/0.4ML IJ SOSY: 40.0000 mg | PREFILLED_SYRINGE | INTRAMUSCULAR | 0 refills | Status: AC

## 2021-06-21 MED ORDER — OXYCODONE HCL 5 MG PO TABS
10.0000 mg | ORAL_TABLET | ORAL | Status: DC | PRN
  Administered 2021-06-21 – 2021-06-23 (×9): 10 mg via ORAL
  Filled 2021-06-21 (×10): qty 2

## 2021-06-21 MED ORDER — PHENOL 1.4 % MT LIQD: 1.0000 | OROMUCOSAL | Status: DC | PRN

## 2021-06-21 MED ORDER — SCOPOLAMINE 1 MG/3DAYS TD PT72
MEDICATED_PATCH | TRANSDERMAL | Status: DC | PRN
  Administered 2021-06-21: 1 via TRANSDERMAL

## 2021-06-21 MED ORDER — ENOXAPARIN SODIUM 40 MG/0.4ML IJ SOSY
40.0000 mg | PREFILLED_SYRINGE | INTRAMUSCULAR | Status: DC
  Administered 2021-06-22 – 2021-06-23 (×2): 40 mg via SUBCUTANEOUS
  Filled 2021-06-21 (×2): qty 0.4

## 2021-06-21 MED ORDER — DEXAMETHASONE SODIUM PHOSPHATE 10 MG/ML IJ SOLN
INTRAMUSCULAR | Status: AC
  Filled 2021-06-21: qty 1

## 2021-06-21 MED ORDER — MENTHOL 3 MG MT LOZG: 1.0000 | LOZENGE | OROMUCOSAL | Status: DC | PRN

## 2021-06-21 MED ORDER — DEXAMETHASONE SODIUM PHOSPHATE 10 MG/ML IJ SOLN
INTRAMUSCULAR | Status: DC | PRN
  Administered 2021-06-21: 5 mg via INTRAVENOUS

## 2021-06-21 MED ORDER — METHOCARBAMOL 1000 MG/10ML IJ SOLN
500.0000 mg | Freq: Four times a day (QID) | INTRAVENOUS | Status: DC | PRN
  Filled 2021-06-21: qty 5

## 2021-06-21 MED ORDER — FENTANYL CITRATE (PF) 250 MCG/5ML IJ SOLN
INTRAMUSCULAR | Status: AC
  Filled 2021-06-21: qty 5

## 2021-06-21 MED ORDER — BUPIVACAINE-EPINEPHRINE (PF) 0.25% -1:200000 IJ SOLN
INTRAMUSCULAR | Status: AC
  Filled 2021-06-21: qty 30

## 2021-06-21 MED ORDER — PHENYLEPHRINE 40 MCG/ML (10ML) SYRINGE FOR IV PUSH (FOR BLOOD PRESSURE SUPPORT)
PREFILLED_SYRINGE | INTRAVENOUS | Status: DC | PRN
  Administered 2021-06-21: 40 ug via INTRAVENOUS

## 2021-06-21 MED ORDER — HYDROMORPHONE HCL 1 MG/ML IJ SOLN
1.0000 mg | INTRAMUSCULAR | Status: AC | PRN
Start: 2021-06-21 — End: 2021-06-22

## 2021-06-21 MED ORDER — MIDAZOLAM HCL 2 MG/2ML IJ SOLN
INTRAMUSCULAR | Status: DC | PRN
Start: 2021-06-21 — End: 2021-06-21
  Administered 2021-06-21: 2 mg via INTRAVENOUS

## 2021-06-21 MED ORDER — LIDOCAINE 2% (20 MG/ML) 5 ML SYRINGE
INTRAMUSCULAR | Status: DC | PRN
  Administered 2021-06-21: 40 mg via INTRAVENOUS

## 2021-06-21 MED ORDER — CEFAZOLIN SODIUM-DEXTROSE 2-4 GM/100ML-% IV SOLN
INTRAVENOUS | Status: AC
  Filled 2021-06-21: qty 100

## 2021-06-21 MED ORDER — SUCCINYLCHOLINE 20MG/ML (10ML) SYRINGE FOR MEDFUSION PUMP - OPTIME
INTRAMUSCULAR | Status: DC | PRN
  Administered 2021-06-21: 160 mg via INTRAVENOUS

## 2021-06-21 MED ORDER — KETAMINE HCL 10 MG/ML IJ SOLN
INTRAMUSCULAR | Status: DC | PRN
  Administered 2021-06-21 (×3): 10 mg via INTRAVENOUS
  Administered 2021-06-21: 20 mg via INTRAVENOUS
  Administered 2021-06-21 (×2): 10 mg via INTRAVENOUS

## 2021-06-21 MED ORDER — CEFAZOLIN SODIUM-DEXTROSE 2-4 GM/100ML-% IV SOLN
2.0000 g | INTRAVENOUS | Status: AC
  Administered 2021-06-21 (×2): 2 g via INTRAVENOUS
  Filled 2021-06-21: qty 100

## 2021-06-21 MED ORDER — PHENYLEPHRINE 40 MCG/ML (10ML) SYRINGE FOR IV PUSH (FOR BLOOD PRESSURE SUPPORT)
PREFILLED_SYRINGE | INTRAVENOUS | Status: AC
  Filled 2021-06-21: qty 10

## 2021-06-21 MED ORDER — ONDANSETRON HCL 4 MG/2ML IJ SOLN
INTRAMUSCULAR | Status: AC
  Filled 2021-06-21: qty 2

## 2021-06-21 MED ORDER — SCOPOLAMINE 1 MG/3DAYS TD PT72
MEDICATED_PATCH | TRANSDERMAL | Status: AC
  Filled 2021-06-21: qty 1

## 2021-06-21 MED ORDER — PROPOFOL 10 MG/ML IV BOLUS
INTRAVENOUS | Status: DC | PRN
Start: 2021-06-21 — End: 2021-06-21
  Administered 2021-06-21: 50 mg via INTRAVENOUS
  Administered 2021-06-21: 150 mg via INTRAVENOUS

## 2021-06-21 MED ORDER — 0.9 % SODIUM CHLORIDE (POUR BTL) OPTIME
TOPICAL | Status: DC | PRN
  Administered 2021-06-21: 1000 mL

## 2021-06-21 MED ORDER — SODIUM CHLORIDE 0.9 % IV SOLN: 250.0000 mL | INTRAVENOUS | Status: DC

## 2021-06-21 MED ORDER — ONDANSETRON HCL 4 MG PO TABS: 4.0000 mg | ORAL_TABLET | Freq: Three times a day (TID) | ORAL | 0 refills | Status: AC | PRN

## 2021-06-21 MED ORDER — CHLORHEXIDINE GLUCONATE CLOTH 2 % EX PADS: 6.0000 | MEDICATED_PAD | Freq: Once | CUTANEOUS | Status: DC

## 2021-06-21 MED ORDER — POLYETHYLENE GLYCOL 3350 17 G PO PACK
17.0000 g | PACK | Freq: Every day | ORAL | Status: DC | PRN
  Administered 2021-06-21 – 2021-06-22 (×2): 17 g via ORAL
  Filled 2021-06-21 (×2): qty 1

## 2021-06-21 MED ORDER — OXYCODONE HCL 5 MG PO TABS
5.0000 mg | ORAL_TABLET | ORAL | Status: DC | PRN
  Administered 2021-06-23: 5 mg via ORAL

## 2021-06-21 MED ORDER — METHOCARBAMOL 500 MG PO TABS
500.0000 mg | ORAL_TABLET | Freq: Four times a day (QID) | ORAL | Status: DC | PRN
  Administered 2021-06-21 – 2021-06-23 (×6): 500 mg via ORAL
  Filled 2021-06-21 (×6): qty 1

## 2021-06-21 MED ORDER — FENTANYL CITRATE (PF) 100 MCG/2ML IJ SOLN
25.0000 ug | INTRAMUSCULAR | Status: DC | PRN
  Administered 2021-06-21 (×3): 25 ug via INTRAVENOUS

## 2021-06-21 MED ORDER — FLEET ENEMA 7-19 GM/118ML RE ENEM: 1.0000 | ENEMA | Freq: Once | RECTAL | Status: DC | PRN

## 2021-06-21 MED ORDER — ALBUMIN HUMAN 5 % IV SOLN: INTRAVENOUS | Status: DC | PRN

## 2021-06-21 MED ORDER — HYDROXYZINE HCL 50 MG/ML IM SOLN
50.0000 mg | Freq: Four times a day (QID) | INTRAMUSCULAR | Status: DC | PRN
  Administered 2021-06-21: 50 mg via INTRAMUSCULAR
  Filled 2021-06-21: qty 1

## 2021-06-21 MED ORDER — LISINOPRIL 10 MG PO TABS
10.0000 mg | ORAL_TABLET | Freq: Every day | ORAL | Status: DC
  Administered 2021-06-21 – 2021-06-23 (×3): 10 mg via ORAL
  Filled 2021-06-21 (×3): qty 1

## 2021-06-21 MED ORDER — THROMBIN 20000 UNITS EX SOLR
CUTANEOUS | Status: DC | PRN
  Administered 2021-06-21: 20000 [IU] via TOPICAL

## 2021-06-21 MED ORDER — PROPOFOL 500 MG/50ML IV EMUL
INTRAVENOUS | Status: DC | PRN
  Administered 2021-06-21: 50 ug/kg/min via INTRAVENOUS

## 2021-06-21 MED ORDER — FENTANYL CITRATE (PF) 100 MCG/2ML IJ SOLN
INTRAMUSCULAR | Status: AC
  Filled 2021-06-21: qty 2

## 2021-06-21 MED ORDER — CHLORHEXIDINE GLUCONATE 0.12 % MT SOLN
15.0000 mL | Freq: Once | OROMUCOSAL | Status: AC
  Administered 2021-06-21: 15 mL via OROMUCOSAL
  Filled 2021-06-21: qty 15

## 2021-06-21 MED ORDER — DEXAMETHASONE SODIUM PHOSPHATE 10 MG/ML IJ SOLN
INTRAMUSCULAR | Status: DC | PRN
  Administered 2021-06-21: 5 mg

## 2021-06-21 MED ORDER — FENTANYL CITRATE (PF) 100 MCG/2ML IJ SOLN
INTRAMUSCULAR | Status: DC | PRN
  Administered 2021-06-21 (×7): 50 ug via INTRAVENOUS
  Administered 2021-06-21: 100 ug via INTRAVENOUS
  Administered 2021-06-21: 50 ug via INTRAVENOUS

## 2021-06-21 MED ORDER — KETAMINE HCL 100 MG/ML IJ SOLN
INTRAMUSCULAR | Status: AC
  Filled 2021-06-21: qty 1

## 2021-06-21 MED ORDER — SODIUM CHLORIDE 0.9% FLUSH
3.0000 mL | Freq: Two times a day (BID) | INTRAVENOUS | Status: DC
  Administered 2021-06-22: 3 mL via INTRAVENOUS

## 2021-06-21 MED ORDER — LACTATED RINGERS IV SOLN: INTRAVENOUS | Status: DC | PRN

## 2021-06-21 MED ORDER — SODIUM CHLORIDE 0.9% FLUSH: 3.0000 mL | INTRAVENOUS | Status: DC | PRN

## 2021-06-21 MED ORDER — ACETAMINOPHEN 325 MG PO TABS
650.0000 mg | ORAL_TABLET | ORAL | Status: DC | PRN
  Administered 2021-06-21 – 2021-06-22 (×3): 650 mg via ORAL
  Filled 2021-06-21 (×3): qty 2

## 2021-06-21 MED ORDER — ROCURONIUM BROMIDE 10 MG/ML (PF) SYRINGE
PREFILLED_SYRINGE | INTRAVENOUS | Status: AC
  Filled 2021-06-21: qty 10

## 2021-06-21 MED ORDER — LIDOCAINE 2% (20 MG/ML) 5 ML SYRINGE
INTRAMUSCULAR | Status: AC
  Filled 2021-06-21: qty 5

## 2021-06-21 SURGICAL SUPPLY — 89 items
APPLIER CLIP 11 MED OPEN (CLIP)
BAG COUNTER SPONGE SURGICOUNT (BAG) ×9 IMPLANT
BLADE CLIPPER SURG (BLADE) ×6 IMPLANT
BLADE SURG 10 STRL SS (BLADE) ×6 IMPLANT
CLIP APPLIE 11 MED OPEN (CLIP) IMPLANT
CLIP LIGATING EXTRA MED SLVR (CLIP) IMPLANT
CLIP NEUROVISION LG (CLIP) ×3 IMPLANT
CLSR STERI-STRIP ANTIMIC 1/2X4 (GAUZE/BANDAGES/DRESSINGS) ×6 IMPLANT
CORD BIPOLAR FORCEPS 12FT (ELECTRODE) ×6 IMPLANT
COVER SURGICAL LIGHT HANDLE (MISCELLANEOUS) ×6 IMPLANT
DRAIN CHANNEL 15F RND FF W/TCR (WOUND CARE) IMPLANT
DRAPE C-ARM 42X72 X-RAY (DRAPES) ×6 IMPLANT
DRAPE C-ARMOR (DRAPES) ×6 IMPLANT
DRAPE INCISE IOBAN 66X45 STRL (DRAPES) ×6 IMPLANT
DRAPE LAPAROTOMY T 102X78X121 (DRAPES) ×6 IMPLANT
DRAPE SURG 17X23 STRL (DRAPES) ×15 IMPLANT
DRAPE U-SHAPE 47X51 STRL (DRAPES) ×6 IMPLANT
DRSG OPSITE POSTOP 3X4 (GAUZE/BANDAGES/DRESSINGS) ×6 IMPLANT
DRSG OPSITE POSTOP 4X8 (GAUZE/BANDAGES/DRESSINGS) ×3 IMPLANT
DURAPREP 26ML APPLICATOR (WOUND CARE) ×6 IMPLANT
ELECT BLADE 4.0 EZ CLEAN MEGAD (MISCELLANEOUS) ×6
ELECT CAUTERY BLADE 6.4 (BLADE) ×3 IMPLANT
ELECT PENCIL ROCKER SW 15FT (MISCELLANEOUS) ×3 IMPLANT
ELECT REM PT RETURN 9FT ADLT (ELECTROSURGICAL) ×6
ELECTRODE BLDE 4.0 EZ CLN MEGD (MISCELLANEOUS) ×4 IMPLANT
ELECTRODE REM PT RTRN 9FT ADLT (ELECTROSURGICAL) ×4 IMPLANT
GLOVE OPTIFIT SS 7.5 STRL LX (GLOVE) ×3 IMPLANT
GLOVE SRG 8 PF TXTR STRL LF DI (GLOVE) ×4 IMPLANT
GLOVE SURG ENC MOIS LTX SZ6.5 (GLOVE) ×6 IMPLANT
GLOVE SURG ENC MOIS LTX SZ7.5 (GLOVE) IMPLANT
GLOVE SURG MICRO LTX SZ8.5 (GLOVE) ×6 IMPLANT
GLOVE SURG NONLX 8.0 ULT (GLOVE) ×3 IMPLANT
GLOVE SURG UNDER POLY LF SZ6.5 (GLOVE) ×6 IMPLANT
GLOVE SURG UNDER POLY LF SZ8 (GLOVE) ×2
GLOVE SURG UNDER POLY LF SZ8.5 (GLOVE) ×6 IMPLANT
GOWN STRL REUS W/ TWL LRG LVL3 (GOWN DISPOSABLE) ×10 IMPLANT
GOWN STRL REUS W/ TWL XL LVL3 (GOWN DISPOSABLE) ×2 IMPLANT
GOWN STRL REUS W/TWL 2XL LVL3 (GOWN DISPOSABLE) ×9 IMPLANT
GOWN STRL REUS W/TWL LRG LVL3 (GOWN DISPOSABLE) ×5
GOWN STRL REUS W/TWL XL LVL3 (GOWN DISPOSABLE) ×1
GRAFT TRIN ELITE MED MUSC TRAN (Graft) ×3 IMPLANT
GUIDEWIRE NITINOL BEVEL TIP (WIRE) ×3 IMPLANT
INSERT FOGARTY 61MM (MISCELLANEOUS) IMPLANT
INSERT FOGARTY SM (MISCELLANEOUS) IMPLANT
KIT BASIN OR (CUSTOM PROCEDURE TRAY) ×3 IMPLANT
KIT POSITION SURG JACKSON T1 (MISCELLANEOUS) ×3 IMPLANT
KIT TURNOVER KIT B (KITS) ×3 IMPLANT
MARKER PEN SURG W/LABELS BLK (STERILIZATION PRODUCTS) ×6 IMPLANT
MODULE EMG NEEDLE SSEP NVM5 (NEEDLE) ×3 IMPLANT
MODULE NVM5 NEXT GEN EMG (NEEDLE) ×3 IMPLANT
NEEDLE HYPO 22GX1.5 SAFETY (NEEDLE) ×3 IMPLANT
NEEDLE SPNL 18GX3.5 QUINCKE PK (NEEDLE) ×3 IMPLANT
NS IRRIG 1000ML POUR BTL (IV SOLUTION) ×6 IMPLANT
PACK LAMINECTOMY ORTHO (CUSTOM PROCEDURE TRAY) ×3 IMPLANT
PACK UNIVERSAL I (CUSTOM PROCEDURE TRAY) ×9 IMPLANT
PAD ARMBOARD 7.5X6 YLW CONV (MISCELLANEOUS) ×12 IMPLANT
PROBE BALL TIP NVM5 SNG USE (BALLOONS) ×3 IMPLANT
ROD RELINE MAS TI LORD 5.5X40 (Rod) ×6 IMPLANT
SCREW CERV FA SD 5.5X25 (Screw) ×6 IMPLANT
SCREW LOCK RELINE 5.5 TULIP (Screw) ×12 IMPLANT
SCREW RELINE RED 6.5X45MM POLY (Screw) ×12 IMPLANT
SPACER HEDRON 26X34X13 15D (Spacer) ×3 IMPLANT
SPONGE INTESTINAL PEANUT (DISPOSABLE) ×15 IMPLANT
SPONGE SURGIFOAM ABS GEL 100 (HEMOSTASIS) ×3 IMPLANT
SPONGE T-LAP 4X18 ~~LOC~~+RFID (SPONGE) ×9 IMPLANT
STAPLER VISISTAT 35W (STAPLE) ×3 IMPLANT
STRIP CLOSURE SKIN 1/2X4 (GAUZE/BANDAGES/DRESSINGS) ×6 IMPLANT
SURGIFLO W/THROMBIN 8M KIT (HEMOSTASIS) ×6 IMPLANT
SUT MNCRL AB 3-0 PS2 18 (SUTURE) ×6 IMPLANT
SUT MON AB 3-0 SH 27 (SUTURE) ×1
SUT MON AB 3-0 SH27 (SUTURE) ×2 IMPLANT
SUT PDS AB 1 CTX 36 (SUTURE) ×6 IMPLANT
SUT SILK 0 TIES 10X30 (SUTURE) ×3 IMPLANT
SUT SILK 2 0 TIES 10X30 (SUTURE) ×3 IMPLANT
SUT SILK 2 0SH CR/8 30 (SUTURE) IMPLANT
SUT SILK 3 0 TIES 17X18 (SUTURE) ×1
SUT SILK 3 0SH CR/8 30 (SUTURE) IMPLANT
SUT SILK 3-0 18XBRD TIE BLK (SUTURE) ×2 IMPLANT
SUT VIC AB 0 CT1 27 (SUTURE) ×1
SUT VIC AB 0 CT1 27XBRD ANBCTR (SUTURE) ×2 IMPLANT
SUT VIC AB 2-0 CT1 18 (SUTURE) ×12 IMPLANT
SYR BULB IRRIG 60ML STRL (SYRINGE) ×3 IMPLANT
SYR CONTROL 10ML LL (SYRINGE) ×3 IMPLANT
TOWEL GREEN STERILE (TOWEL DISPOSABLE) ×9 IMPLANT
TOWEL GREEN STERILE FF (TOWEL DISPOSABLE) ×3 IMPLANT
TRAY FOLEY W/BAG SLVR 16FR (SET/KITS/TRAYS/PACK) ×1
TRAY FOLEY W/BAG SLVR 16FR ST (SET/KITS/TRAYS/PACK) ×2 IMPLANT
WATER STERILE IRR 1000ML POUR (IV SOLUTION) IMPLANT
YANKAUER SUCT BULB TIP NO VENT (SUCTIONS) ×6 IMPLANT

## 2021-06-21 NOTE — OR Nursing (Signed)
Dr. Charise Killian confirmed negative x-ray @ 1123.

## 2021-06-21 NOTE — Discharge Instructions (Signed)

## 2021-06-21 NOTE — Interval H&P Note (Signed)
History and Physical Interval Note:  06/21/2021 6:59 AM  Andres West  has presented today for surgery, with the diagnosis of Degenerative disc disease L5-S1.  The various methods of treatment have been discussed with the patient and family. After consideration of risks, benefits and other options for treatment, the patient has consented to  Procedure(s) with comments: ANTERIOR AND POSTERIOR SPINAL FUSION (ALIF L5-S1, POSTERIOR SPINAL FUSION INTERBODY L5-S1) (N/A) - 4.5 HRS DR. Saylee Sherrill TO DO APPROACH 3C-BED TAP BLOCK WITH EXPAREL ABDOMINAL EXPOSURE (N/A) as a surgical intervention.  The patient's history has been reviewed, patient examined, no change in status, stable for surgery.  I have reviewed the patient's chart and labs.  Questions were answered to the patient's satisfaction.     [name]

## 2021-06-21 NOTE — Transfer of Care (Signed)
Immediate Anesthesia Transfer of Care Note  Patient: Andres West  Procedure(s) Performed: ANTERIOR AND POSTERIOR SPINAL FUSION (ALIF L5-S1, POSTERIOR SPINAL FUSION INTERBODY L5-S1) (Back) ABDOMINAL EXPOSURE (Abdomen)  Patient Location: PACU  Anesthesia Type:General  Level of Consciousness: drowsy  Airway & Oxygen Therapy: Patient Spontanous Breathing and Patient connected to nasal cannula oxygen  Post-op Assessment: Report given to RN and Post -op Vital signs reviewed and stable  Post vital signs: Reviewed and stable  Last Vitals:  Vitals Value Taken Time  BP 129/82 06/21/21 1342  Temp    Pulse 109 06/21/21 1344  Resp 16 06/21/21 1344  SpO2 100 % 06/21/21 1344  Vitals shown include unvalidated device data.  Last Pain:  Vitals:   06/21/21 0615  TempSrc:   PainSc: 4       Patients Stated Pain Goal: 4 (06/21/21 0615)  Complications: No notable events documented.

## 2021-06-21 NOTE — Anesthesia Postprocedure Evaluation (Signed)
Anesthesia Post Note  Patient: Andres West  Procedure(s) Performed: ANTERIOR AND POSTERIOR SPINAL FUSION (ALIF L5-S1, POSTERIOR SPINAL FUSION INTERBODY L5-S1) (Back) ABDOMINAL EXPOSURE (Abdomen)     Patient location during evaluation: PACU Anesthesia Type: Regional and General Level of consciousness: awake and alert Pain management: pain level controlled Vital Signs Assessment: post-procedure vital signs reviewed and stable Respiratory status: spontaneous breathing, nonlabored ventilation, respiratory function stable and patient connected to nasal cannula oxygen Cardiovascular status: blood pressure returned to baseline and stable Postop Assessment: no apparent nausea or vomiting Anesthetic complications: no   No notable events documented.  Last Vitals:  Vitals:   06/21/21 1515 06/21/21 1550  BP: (!) 141/84 (!) 140/94  Pulse: 98 100  Resp: (!) 7 18  Temp: (!) 36.2 C 36.5 C  SpO2: 96% 100%    Last Pain:  Vitals:   06/21/21 1550  TempSrc: Oral  PainSc:                  Taray Normoyle L Jacynda Brunke

## 2021-06-21 NOTE — Anesthesia Procedure Notes (Signed)
Anesthesia Regional Block: TAP block   Pre-Anesthetic Checklist: , timeout performed,  Correct Patient, Correct Site, Correct Laterality,  Correct Procedure, Correct Position, site marked,  Risks and benefits discussed,  Surgical consent,  Pre-op evaluation,  At surgeon's request and post-op pain management  Laterality: Left  Prep: Maximum Sterile Barrier Precautions used, chloraprep       Needles:  Injection technique: Single-shot  Needle Type: Echogenic Stimulator Needle     Needle Length: 9cm  Needle Gauge: 22     Additional Needles:   Procedures:,,,, ultrasound used (permanent image in chart),,    Narrative:  Start time: 06/21/2021 7:18 AM End time: 06/21/2021 7:22 AM Injection made incrementally with aspirations every 5 mL.  Performed by: Personally  Anesthesiologist: Elmer Picker, MD  Additional Notes: Monitors applied. No increased pain on injection. No increased resistance to injection. Injection made in 5cc increments. Good needle visualization. Patient tolerated procedure well.

## 2021-06-21 NOTE — Op Note (Signed)
DATE OF SERVICE: 06/21/2021  PATIENT:  Andres West  40 y.o. male  PRE-OPERATIVE DIAGNOSIS:  lumbrosacral disease with neuropathic pain  POST-OPERATIVE DIAGNOSIS:  Same  PROCEDURE:   Anterior exposure of L5/S1 disc space  SURGEON:  Surgeon(s) and Role: Panel 1:    Venita Lick, MD - Primary Panel 2:    * [name], MD - Primary    * Victorino Sparrow, MD - Assisting  ASSISTANT: Fara Olden, MD  An assistant was required to facilitate exposure and expedite the case.  ANESTHESIA:   general  EBL:  BLOOD ADMINISTERED:none  DRAINS: none   LOCAL MEDICATIONS USED:  NONE  SPECIMEN:  none  COUNTS: confirmed correct.  TOURNIQUET:  none  PATIENT DISPOSITION:  PACU - hemodynamically stable.   Delay start of Pharmacological VTE agent (>24hrs) due to surgical blood loss or risk of bleeding: no  INDICATION FOR PROCEDURE: Andres West is a 40 y.o. male with lumbosacral pain and neuropathic pain into the buttock. After careful discussion of risks, benefits, and alternatives the patient was offered anterior lumbar interbody fusion of the L5/S1 disc base with Dr. Shon Baton. We specifically discussed risk of injury to ureter, bowel, vascular structures. The patient understood and wished to proceed.  OPERATIVE FINDINGS: Unremarkable anterior exposure of the L5/S1 space.  DESCRIPTION OF PROCEDURE: After identification of the patient in the pre-operative holding area, the patient was transferred to the operating room. The patient was positioned supine on the operating room table. Anesthesia was induced. The abdomen was prepped and draped in standard fashion. A surgical pause was performed confirming correct patient, procedure, and operative location.  Intraoperative fluoroscopy was used to mark the skin overlying the L5/S1 disc space.  An incision was planned and made in a transverse fashion in the left lower quadrant.  Exposure was carried down through subtenons tissue  until the anterior rectus fascia was identified and divided longitudinally.  The rectus muscle was mobilized medially and laterally.  A blunt retroperitoneal plane was developed and then carried over the psoas muscle taking great care to avoid injuring the bowel, and ureters.  The peritoneum was protected.  Blunt exposure was carried down onto the anterior aspect of the sacral spine.  We identified the space and inserted a needle into it.  Unfortunately this was below our level.  Our exposure was carried cranially.  We identified the left common iliac vein and protected this throughout our exposure.  I self-retaining retractor system was positioned into the field.  The disc base was confirmed as the L5/S1 disc space.  Dr. Shon Baton joined the case at this point and confirmed that he had enough space to work.  We reviewed the relevant anatomy.  I turned the case over to Dr. Shon Baton at this point.Andres Brunt Lenell Antu, MD [facility] Phone Number: [phone] 06/21/2021 10:03 AM

## 2021-06-21 NOTE — Anesthesia Procedure Notes (Signed)
Procedure Name: Intubation Date/Time: 06/21/2021 7:44 AM Performed by: Renford Dills, CRNA Pre-anesthesia Checklist: Patient identified, Patient being monitored, Timeout performed, Emergency Drugs available and Suction available Patient Re-evaluated:Patient Re-evaluated prior to induction Oxygen Delivery Method: Circle System Utilized Preoxygenation: Pre-oxygenation with 100% oxygen Induction Type: IV induction Ventilation: Mask ventilation without difficulty Laryngoscope Size: Miller and 2 Grade View: Grade II Tube type: Oral Tube size: 8.0 mm Number of attempts: 1 Airway Equipment and Method: stylet Placement Confirmation: ETT inserted through vocal cords under direct vision, positive ETCO2 and breath sounds checked- equal and bilateral Secured at: 24 cm Tube secured with: Tape Dental Injury: Teeth and Oropharynx as per pre-operative assessment

## 2021-06-21 NOTE — H&P (Signed)
Addendum H&P: Patient continues to have significant back buttock and neuropathic leg pain.  Despite conservative management his quality of life continues to deteriorate.  Plan is to move forward with an anterior posterior L5-S1 fusion.  I have gone over the procedure along with the risks, benefits, and alternatives with the patient.  He is expressed an understanding as well as willingness to move forward with surgery.  There has been no change in the patient's clinical exam from his last office note on 06/12/2021.

## 2021-06-21 NOTE — Op Note (Signed)
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  She is noted to have transitional anatomy with a rudimentary S1/S2 disc space.  POST-OPERATIVE DIAGNOSIS: Same  PROCEDURE:   Anterior lumbar interbody fusion L5-S1. Posterior spinal fusion with instrumentation L5-S1  SURGEON:  Venita Lick, MD  PHYSICIAN ASSISTANT: Voncille Lo,  PA  Approach surgeon: Dr. Elna Breslow  ANESTHESIA:   General  EBL: 250 ml   Complications: None  Implants: Globus Hedron IA cage.  13 mm medium 15 degree lordotic.  25 mm locking screws: 1 into the body of L5, the other into the body of S1. NuVasive MIS pedicle screws.  6.5 x 45 mm length.  40 mm locking rod.  Graft: Trinity Elite  Monitoring: No abnormal SSEP or evoked motor potential activity during the posterior portion of the surgery.  All 4 pedicle screws were directly stimulated and there was no adverse activity at greater than 40 mA.  BRIEF HISTORY: Andres West is a 40 y.o. male who scented to my office with significant back buttock and neuropathic right leg pain.  Imaging studies demonstrated advanced degenerative disc disease with slight retrolisthesis at L5-S1.  Patient had a rudimentary S1/2 disc space and was noted to have transitional anatomy.  Based on the location when counting from the last rib-bearing vertebrae it was noted to be degenerative disease at L5-S1.  Because of the failure of conservative management to improve his quality of life we elected to move forward with surgery.  All appropriate risks, benefits, alternatives to surgery were discussed and consent was obtained.  PROCEDURE DETAILS: Patient was brought into the operating room and was properly positioned on the operating room table.  After induction with general anesthesia the patient was  endotracheally intubated.  Foley, teds and SCDs were applied.  A timeout was taken to confirm all important data: Including patient, procedure, and the level.  Patient was prepped and draped in a standard fashion.  The abdomen was exposed.  After using fluoroscopy to mark out our incision site Dr. Lenell Antu formed a standard anterior retroperitoneal approach to the lumbar spine.  Please refer to his dictation for specifics.  Once the L5-S1 disc space was properly identified and the retractors were positioned we placed the needle into the disc space and took a second x-ray.  Once confirming we are at the appropriate level he scrubbed out and I came into the case to continue the procedure.  An annulotomy was performed and I used pituitary rongeurs and curettes to remove the bulk of the disc material.  I continue to work posteriorly removing all of the disc and cartilaginous endplate.  Distraction plugs were placed in order to facilitate working posteriorly.  Using a fine curved curette I was able to work behind the S1 vertebral body and released the annulus.  I continue to debride the posterior L5 osteophyte.  I was able to remove the bulk of the but because of the slight retrolisthesis I could not remove the bone spur in its entirety.  However I was able to distract the intervertebral space with a lamina spreader and confirmed parallel endplate distraction and improved foraminal volume.  With satisfactory discectomy I felt this was solid fusion with be sufficient in reducing his pain and improving his quality of life.  At this point I was pleased with the overall decompression.  I rasped the endplates and then used the trial spacers to determine the best size implant to use.  The implant was obtained packed with the allograft and malleted to the appropriate depth.  We had excellent fixation in both the AP and lateral planes.  I then inserted a single locking screw through the cage and into the L5 vertebral body and  a second through the cage and into the S1 vertebral body this would just prevent migration of the cage and improve overall stability.  With the fixation complete I locked the screw to prevent backout using manufacture standards. At this point I copiously irrigated the wound with normal saline.  I placed Floseal to aid in hemostasis.  Tractors were removed sequentially and there was no bleeding noted.  The deep fascia of the rectus was closed with a running #1 PDS suture.  I then used a running 0 Vicryl suture stitch, then 2-0 Vicryl suture, and a 3-0 Monocryl for the skin.  Steri-Strips and dry dressings were applied.  The Spine frame was then secured over the patient and the patient was rotated into the prone position.  The arms were placed in the 9090 position and all bony prominences were padded.  The lumbar spine was then prepped and draped in a standard fashion.  A second timeout was done to confirm patient procedure and all other important data.  Using fluoroscopy identified the lateral border of the L5 and S1 pedicles and marked the space out.  Infiltrated the area with quarter percent Marcaine with epinephrine.  A Wiltsie incision was made and I advanced the Jamshidi needle occasionally down to the lateral aspect of the L5 pedicle.  I confirmed positioning with the AP view and then advanced the Jamshidi needle into the pedicle.  In addition to using fluoroscopy I also had real-time neuro monitoring by directly stimulating the Jamshidi.  As I neared the medial wall of the pedicle on the AP view I switched the fluoroscopy view to the lateral image.  I confirmed that it was just beyond the posterior wall of the vertebral body in the lateral view confirming satisfactory trajectory and positioning of the Jamshidi needle.  I then advanced into the vertebral body and then placed the guidepin to cannulate the pedicle.  I repeated this exact same technique at S1 and on the contralateral side.  Once all 4  pedicles were cannulated I then measured and elected to use a 6.5 x 45 mm length screws.  We placed the pedicle screws over the guidepin and advanced them using fluoroscopic guidance.  All 4 pedicle screws had excellent purchase.  Guide pins were removed.  Each of the screws were directly stimulated and there was no adverse activity at greater than 40 mA.  We then measured and placed the 40 mm locking rod.  Once we confirm satisfactory position the locking caps were inserted.  All 4 locking caps were tightened/torqued according to manufacture standards.  The insertion tabs were removed and final images were taken.  The pedicle screw construct as well as anterior intervertebral cage was in good position with no migration/subsidence.  The posterior wounds were irrigated with normal saline and closed in a layered fashion with interrupted 0 Vicryl suture, 2-0 Vicryl suture, and a 3-0 Monocryl.  Steri-Strips and dry dressings were applied and the patient was ultimately extubated transfer the PACU without incident.  The end of the case all needle and sponge counts were correct.  There were no adverse intraoperative events.  Venita Lick, MD 06/21/2021 12:46 PM

## 2021-06-21 NOTE — Brief Op Note (Signed)
06/21/2021  1:06 PM  PATIENT:  Ausencio A Browning  40 y.o. male  PRE-OPERATIVE DIAGNOSIS:  Degenerative disc disease L5-S1  POST-OPERATIVE DIAGNOSIS:  Degenerative disc disease L5-S1  PROCEDURE:  Procedure(s) with comments: ANTERIOR AND POSTERIOR SPINAL FUSION (ALIF L5-S1, POSTERIOR SPINAL FUSION INTERBODY L5-S1) (N/A) - 4.5 HRS DR. HAWKEN TO DO APPROACH 3C-BED TAP BLOCK WITH EXPAREL ABDOMINAL EXPOSURE (N/A)  SURGEON:  Surgeon(s) and Role: Panel 1:    Venita Lick, MD - Primary Panel 2:    * [name], MD - Primary    * Victorino Sparrow, MD - Assisting  PHYSICIAN ASSISTANT:   ASSISTANTS: Voncille Lo, PA  ANESTHESIA:   general  EBL:  200 mL   BLOOD ADMINISTERED:none  DRAINS: none   LOCAL MEDICATIONS USED:  MARCAINE     SPECIMEN:  No Specimen  DISPOSITION OF SPECIMEN:  N/A  COUNTS:  YES  TOURNIQUET:  * No tourniquets in log *  DICTATION: .Dragon Dictation  PLAN OF CARE: Admit to inpatient   PATIENT DISPOSITION:  PACU - hemodynamically stable.

## 2021-06-22 ENCOUNTER — Encounter (HOSPITAL_COMMUNITY): Payer: Self-pay | Admitting: Orthopedic Surgery

## 2021-06-22 ENCOUNTER — Inpatient Hospital Stay (HOSPITAL_COMMUNITY): Payer: No Typology Code available for payment source

## 2021-06-22 DIAGNOSIS — Z9889 Other specified postprocedural states: Secondary | ICD-10-CM

## 2021-06-22 MED ORDER — ENOXAPARIN (LOVENOX) PATIENT EDUCATION KIT
PACK | Freq: Once | Status: AC
  Filled 2021-06-22: qty 1

## 2021-06-22 MED ORDER — SORBITOL 70 % SOLN
30.0000 mL | Freq: Every day | Status: DC | PRN
  Administered 2021-06-22: 30 mL via ORAL
  Filled 2021-06-22: qty 30

## 2021-06-22 MED FILL — Heparin Sodium (Porcine) Inj 1000 Unit/ML: INTRAMUSCULAR | Qty: 30 | Status: AC

## 2021-06-22 MED FILL — Sodium Chloride IV Soln 0.9%: INTRAVENOUS | Qty: 1000 | Status: AC

## 2021-06-22 NOTE — Progress Notes (Signed)
BLE venous duplex has been completed.  Results can be found under chart review under CV PROC. 06/22/2021 11:20 AM Savi Lastinger RVT, RDMS

## 2021-06-22 NOTE — Evaluation (Signed)
Physical Therapy Evaluation Patient Details Name: Andres West MRN: [id number] DOB: 12/28/1980 Today's Date: 06/22/2021   History of Present Illness  40 y.o. male presents to [facility] hospital on 06/21/2021 with back, buttock and neuropathic leg pain. Pt underwent L5-S1 ALIF and PLIF on 06/21/2021. PMH includes back spasm and previous back surgery.  Clinical Impression  Pt presents to PT with deficits in activity tolerance, gait, and power. Pt is able to ambulate for limited community distances with reduced gait speed at this time. Pt demonstrates no losses of balance and negotiates stairs without assistance. PT encourages pt to ambulate multiple times daily for the remainder of this admission. Pt requires no further acute PT services at this time. Acute PT signing off.    Follow Up Recommendations No PT follow up    Equipment Recommendations  None recommended by PT    Recommendations for Other Services       Precautions / Restrictions Precautions Precautions: Back Precaution Booklet Issued: Yes (comment) Precaution Comments: Back precautions Required Braces or Orthoses: Spinal Brace Spinal Brace: Lumbar corset (on upon arrival, left on after session) Restrictions Weight Bearing Restrictions: No      Mobility  Bed Mobility Overal bed mobility: Modified Independent             General bed mobility comments: pt verbalizes log roll technique    Transfers Overall transfer level: Independent Equipment used: None             General transfer comment: Completes sit<>stand with Mod I and bed slightly raised.  Ambulation/Gait Ambulation/Gait assistance: Modified independent (Device/Increase time) Gait Distance (Feet): 300 Feet Assistive device: None Gait Pattern/deviations: Step-through pattern Gait velocity: functional Gait velocity interpretation: 1.31 - 2.62 ft/sec, indicative of limited community ambulator General Gait Details: pt with slowed step-through  gait  Stairs Stairs: Yes Stairs assistance: Modified independent (Device/Increase time) Stair Management: One rail Left;Step to pattern;Forwards Number of Stairs: 4    Wheelchair Mobility    Modified Rankin (Stroke Patients Only)       Balance Overall balance assessment: Independent                                           Pertinent Vitals/Pain Pain Assessment: 0-10 Pain Score: 5  Pain Location: low back Pain Descriptors / Indicators: Aching Pain Intervention(s): Monitored during session    Home Living Family/patient expects to be discharged to:: Private residence Living Arrangements: Spouse/significant other Available Help at Discharge: Family Type of Home: House Home Access: Stairs to enter Entrance Stairs-Rails: Can reach both Entrance Stairs-Number of Steps: 5 Home Layout: One level Home Equipment: Cane - single point Additional Comments: With    Prior Function Level of Independence: Independent         Comments: 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 Has wife and four children.     Hand Dominance   Dominant Hand: Right    Extremity/Trunk Assessment   Upper Extremity Assessment Upper Extremity Assessment: Overall WFL for tasks assessed    Lower Extremity Assessment Lower Extremity Assessment: Overall WFL for tasks assessed    Cervical / Trunk Assessment Cervical / Trunk Assessment: Other exceptions Cervical / Trunk Exceptions: s/p Lumbar surgery  Communication   Communication: No difficulties  Cognition Arousal/Alertness: Awake/alert Behavior During Therapy: WFL for tasks assessed/performed Overall Cognitive Status: Within Functional Limits for tasks assessed  General Comments: Pt able to recall and follow all back precautions and maintained conversation/ Divided attention during ADL tasks.      General Comments General comments (skin integrity, edema, etc.): VSS on  RA    Exercises     Assessment/Plan    PT Assessment Patent does not need any further PT services  PT Problem List         PT Treatment Interventions      PT Goals (Current goals can be found in the Care Plan section)  Acute Rehab PT Goals Patient Stated Goal: to go home, to have less pain    Frequency     Barriers to discharge        Co-evaluation               AM-PAC PT "6 Clicks" Mobility  Outcome Measure Help needed turning from your back to your side while in a flat bed without using bedrails?: None Help needed moving from lying on your back to sitting on the side of a flat bed without using bedrails?: None Help needed moving to and from a bed to a chair (including a wheelchair)?: None Help needed standing up from a chair using your arms (e.g., wheelchair or bedside chair)?: None Help needed to walk in hospital room?: None Help needed climbing 3-5 steps with a railing? : None 6 Click Score: 24    End of Session Equipment Utilized During Treatment: Back brace Activity Tolerance: Patient tolerated treatment well Patient left: in chair;with call bell/phone within reach Nurse Communication: Mobility status PT Visit Diagnosis: Other abnormalities of gait and mobility (R26.89)    Time: 7622-6333 PT Time Calculation (min) (ACUTE ONLY): 13 min   Charges:   PT Evaluation $PT Eval Low Complexity: 1 Low          Arlyss Gandy, PT, DPT Acute Rehabilitation Pager: [phone]   Arlyss Gandy 06/22/2021, 9:08 AM

## 2021-06-22 NOTE — Evaluation (Signed)
Occupational Therapy Evaluation Patient Details Name: Andres West MRN: [id number] DOB: [birth year] Today's Date: 06/22/2021    History of Present Illness 40 year old male s/p ALIF L5-S1 and Posterior fusion L5-S1. PMH of lumbar stenosis and previous back surgery.   Clinical Impression   PTA, pt was living at home with his wife and 4 children, driving, and working as an Chief Financial Officer. Pt is currently Mod I for functional mobility and ADLs. Pt educated on back precautions and given handout with compensatory strategies for adhering to precautions during ADLs and functional mobility. Pt demonstrated understanding of education and all precautions. Recommending d/c home with no further OT services. All OT needs met in the acute setting.    Follow Up Recommendations  No OT follow up    Equipment Recommendations  3 in 1 bedside commode (For showering in case he needs to sit.)    Recommendations for Other Services       Precautions / Restrictions Precautions Precautions: Back Precaution Booklet Issued: Yes (comment) Precaution Comments: Back precautions Required Braces or Orthoses: Spinal Brace Spinal Brace: Lumbar corset Restrictions Weight Bearing Restrictions: No      Mobility Bed Mobility Overal bed mobility: Modified Independent             General bed mobility comments: Demonstrated log rolling technique to sit EOB with no verbal cues to perform correctly.    Transfers Overall transfer level: Modified independent Equipment used: None             General transfer comment: Completes sit<>stand with Mod I and bed slightly raised.    Balance Overall balance assessment: Modified Independent                                         ADL either performed or assessed with clinical judgement   ADL Overall ADL's : Modified independent                                       General ADL Comments: Pt Mod I for all activities, with no LOBs or  unsteadiness during functional activities. Pt educated on back precautions, and given handout on compensatory strategies to adhere to back precautions.     Vision Baseline Vision/History: 0 No visual deficits Ability to See in Adequate Light: 0 Adequate Patient Visual Report: No change from baseline Vision Assessment?: No apparent visual deficits     Perception     Praxis      Pertinent Vitals/Pain       Hand Dominance Right   Extremity/Trunk Assessment Upper Extremity Assessment Upper Extremity Assessment: Overall WFL for tasks assessed   Lower Extremity Assessment Lower Extremity Assessment: Defer to PT evaluation   Cervical / Trunk Assessment Cervical / Trunk Assessment: Other exceptions Cervical / Trunk Exceptions: s/p Lumbar surgery   Communication Communication Communication: No difficulties   Cognition Arousal/Alertness: Awake/alert Behavior During Therapy: WFL for tasks assessed/performed Overall Cognitive Status: Within Functional Limits for tasks assessed                                 General Comments: Pt able to recall and follow all back precautions and maintained conversation/ Divided attention during ADL tasks.   General Comments  Exercises     Shoulder Instructions      Home Living Family/patient expects to be discharged to:: Private residence Living Arrangements: Spouse/significant other Available Help at Discharge: Family Type of Home: House Home Access: Stairs to enter Technical brewer of Steps: 5 Entrance Stairs-Rails: Can reach both Home Layout: One level     Bathroom Shower/Tub: Occupational psychologist: [place] - single point   Additional Comments: With      Prior Functioning/Environment Level of Independence: Independent        Comments: Pt was driving and working as an Chief Financial Officer. Has wife and four children.        OT Problem List: Pain;Decreased  strength;Decreased range of motion      OT Treatment/Interventions:      OT Goals(Current goals can be found in the care plan section) Acute Rehab OT Goals Patient Stated Goal: to go home, to have less pain OT Goal Formulation: With patient Time For Goal Achievement: 07/06/21 Potential to Achieve Goals: Good  OT Frequency:     Barriers to D/C:            Co-evaluation              AM-PAC OT "6 Clicks" Daily Activity     Outcome Measure Help from another person eating meals?: None Help from another person taking care of personal grooming?: None Help from another person toileting, which includes using toliet, bedpan, or urinal?: None Help from another person bathing (including washing, rinsing, drying)?: None Help from another person to put on and taking off regular upper body clothing?: None Help from another person to put on and taking off regular lower body clothing?: None 6 Click Score: 24   End of Session Equipment Utilized During Treatment: Gait belt;Back brace Nurse Communication: Mobility status  Activity Tolerance: Patient tolerated treatment well Patient left: in chair;with call bell/phone within reach  OT Visit Diagnosis: Muscle weakness (generalized) (M62.81);Pain Pain - part of body:  (back)                Time: 4315-4008 OT Time Calculation (min): 25 min Charges:  OT General Charges $OT Visit: 1 Visit OT Evaluation $OT Eval Low Complexity: 1 Low OT Treatments $Self Care/Home Management : 8-22 mins  Jackquline Denmark, OTS Acute Rehab Office: [phone]   Andres West 06/22/2021, 8:22 AM

## 2021-06-22 NOTE — Progress Notes (Addendum)
[facility] [place] PROGRESS NOTE  ASSESSMENT / PLAN: Andres West is a 40 y.o. male status post L5/S1 anterior interbody lumbar fusion 06/21/21. Looks good post op.  No evidence of DVT. Okay for regular diet.  Following.  SUBJECTIVE: Some pain in the incision and back.  Some radicular pain to the legs.  Otherwise no complaints.  No flatus or bowel movement yet.  Tolerating liquids.  OBJECTIVE: BP 103/74 (BP Location: Left Arm)   Pulse (!) 116   Temp 97.9 F (36.6 C) (Oral)   Resp 17   Ht 6' (1.829 m)   Wt 92.1 kg   SpO2 100%   BMI 27.53 kg/m   Intake/Output Summary (Last 24 hours) at 06/22/2021 1359 Last data filed at 06/22/2021 0339 Gross per 24 hour  Intake 359 ml  Output 200 ml  Net 159 ml    No acute distress Lumbar brace in place. Lower extremities not swollen 2+ posterior tibial pulses bilaterally  CBC Latest Ref Rng & Units 06/21/2021 06/13/2021  WBC 4.0 - 10.5 K/uL 11.7(H) 5.9  Hemoglobin 13.0 - 17.0 g/dL 59.5 63.8  Hematocrit 75.6 - 52.0 % 40.4 47.6  Platelets 150 - 400 K/uL 115(L) 153     CMP Latest Ref Rng & Units 06/21/2021 06/13/2021  Glucose 70 - 99 mg/dL - 433(I)  BUN 6 - 20 mg/dL - 12  Creatinine 9.51 - 1.24 mg/dL 8.84 1.66  Sodium 063 - 145 mmol/L - 136  Potassium 3.5 - 5.1 mmol/L - 3.9  Chloride 98 - 111 mmol/L - 101  CO2 22 - 32 mmol/L - 28  Calcium 8.9 - 10.3 mg/dL - 9.5    Estimated Creatinine Clearance: 96.2 mL/min (by C-G formula based on SCr of 1.12 mg/dL).  Rande Brunt. Lenell Antu, MD [facility] Phone Number: [phone] 06/22/2021 1:59 PM

## 2021-06-22 NOTE — Progress Notes (Signed)
    Subjective: Procedure(s) (LRB): ANTERIOR AND POSTERIOR SPINAL FUSION (ALIF L5-S1, POSTERIOR SPINAL FUSION INTERBODY L5-S1) (N/A) ABDOMINAL EXPOSURE (N/A) 1 Day Post-Op  Patient reports pain as 2 on 0-10 scale.  Reports decreased leg pain reports incisional back pain   Positive void Negative bowel movement Negative flatus Negative chest pain or shortness of breath  Objective: Vital signs in last 24 hours: Temp:  [97.2 F (36.2 C)-98.4 F (36.9 C)] 98.1 F (36.7 C) (09/09 0740) Pulse Rate:  [91-119] 110 (09/09 0740) Resp:  [7-29] 17 (09/09 0740) BP: (95-141)/(58-94) 120/78 (09/09 0740) SpO2:  [92 %-100 %] 100 % (09/09 0740)  Intake/Output from previous day: 09/08 0701 - 09/09 0700 In: 3509 [I.V.:2800; IV Piggyback:709] Out: 1250 [Urine:1050; Blood:200]  Labs: Recent Labs    06/21/21 1608  WBC 11.7*  RBC 4.59  HCT 40.4  PLT 115*   Recent Labs    06/21/21 1608  CREATININE 1.12   No results for input(s): LABPT, INR in the last 72 hours.  Physical Exam: Neurologically intact ABD soft Sensation intact distally Dorsiflexion/Plantar flexion intact Incision: dressing C/D/I Compartment soft Body mass index is 27.53 kg/m.   Assessment/Plan: Patient stable  Continue mobilization with physical therapy Continue care  Patient doing exceptionally well status post anterior posterior L5-S1 fusion.  Abdomen is soft and nontender and he has intact peripheral pulses and his compartments are soft and nontender.  Plan on venous Doppler today to rule out DVT. Transition to oral pain medications. Continue mobilization with physical therapy. Plan on discharge tomorrow provided there is positive flatus and/or bowel movement and he is cleared by PT/OT.  Venita Lick, MD [facility] [phone]

## 2021-06-23 NOTE — Progress Notes (Signed)
[facility]  PROGRESS NOTE  ASSESSMENT / PLAN: Andres West is a 40 y.o. male status post L5/S1 anterior interbody lumbar fusion 06/21/21. Looks good post op.  No evidence of DVT. Tachycardia. Agree with EKG. Okay for regular diet.  Following.  SUBJECTIVE: Feeling better. Bowel function returned. Tachycardia continues.   OBJECTIVE: BP 116/84 (BP Location: Left Arm)   Pulse (!) 103   Temp 99.3 F (37.4 C) (Oral)   Resp 20   Ht 6' (1.829 m)   Wt 92.1 kg   SpO2 97%   BMI 27.53 kg/m   Intake/Output Summary (Last 24 hours) at 06/23/2021 0930 Last data filed at 06/22/2021 1843 Gross per 24 hour  Intake 240 ml  Output --  Net 240 ml     No acute distress Abdomen soft, appropriately tender Lower extremities not swollen 2+ posterior tibial pulses bilaterally  CBC Latest Ref Rng & Units 06/21/2021 06/13/2021  WBC 4.0 - 10.5 K/uL 11.7(H) 5.9  Hemoglobin 13.0 - 17.0 g/dL 28.7 68.1  Hematocrit 15.7 - 52.0 % 40.4 47.6  Platelets 150 - 400 K/uL 115(L) 153     CMP Latest Ref Rng & Units 06/21/2021 06/13/2021  Glucose 70 - 99 mg/dL - 262(M)  BUN 6 - 20 mg/dL - 12  Creatinine 3.55 - 1.24 mg/dL 9.74 1.63  Sodium 845 - 145 mmol/L - 136  Potassium 3.5 - 5.1 mmol/L - 3.9  Chloride 98 - 111 mmol/L - 101  CO2 22 - 32 mmol/L - 28  Calcium 8.9 - 10.3 mg/dL - 9.5    Estimated Creatinine Clearance: 96.2 mL/min (by C-G formula based on SCr of 1.12 mg/dL).  Rande Brunt. Lenell Antu, MD [facility] Phone Number: [phone] 06/23/2021 9:30 AM

## 2021-06-23 NOTE — Progress Notes (Signed)
Educated the patient and wife on the importance hydration and pain management due to elevated heart rate. Encouraged to follow up with his primary care physician about elevated heart rate. Patient remained asymptomatic of distress or discomfort.

## 2021-06-23 NOTE — Progress Notes (Signed)
EKG shows sinus tach.  No concerning waveform.  Ok to [employer] home today.  Return precautions given.  Encouraged good oral intake and hydration with pain control.  Follow up with Dr. Shon Baton as scheduled.

## 2021-06-23 NOTE — Progress Notes (Signed)
Instructed patient's wife on how to administer Lovenox injection. She demonstrated by administering the morning's injection.

## 2021-06-23 NOTE — Progress Notes (Signed)
   Subjective:  Andres West is a 40 y.o. male, 2 Days Post-Op  s/p Procedure(s): ANTERIOR AND POSTERIOR SPINAL FUSION (ALIF L5-S1, POSTERIOR SPINAL FUSION INTERBODY L5-S1) ABDOMINAL EXPOSURE   Patient reports pain as mild.  Reports doing well. Has passed gas and had bowel movement. Urinating without issue. Reports mild decreased sensation on dorsal right foot. Patient did have high HR this morning.   Objective:   VITALS:   Vitals:   06/22/21 1729 06/22/21 2035 06/23/21 0009 06/23/21 0426  BP: 105/78 120/80 109/73 116/84  Pulse: (!) 121 97 (!) 103 (!) 103  Resp: 17 20 18 20   Temp: 99.2 F (37.3 C) 99.6 F (37.6 C) 98.6 F (37 C) 99.3 F (37.4 C)  TempSrc: Oral Oral Oral Oral  SpO2: 97% 99% 95% 97%  Weight:      Height:        Constitutional: General Appearance: healthy-appearing, well-nourished, and well-developed. Level of Distress: no acute distress. Eyes: Lens (normal) clear: both eyes. Head: Head: normocephalic and atraumatic. Lungs: Respiratory effort: no dyspnea. Skin: Inspection and palpation: no rash, lesions Neurologic: Cranial Nerves: grossly intact. Sensation: grossly intact. Psychiatric: Insight: good judgement and insight. Mental Status: normal mood and affect and active and alert. Orientation: to time, place, and person.  Healing pubic incision with clean, dry dressings. No signs of infection.  Dorsi and plantar flexion intact.  Compartments soft Neurovascularly intact.   Lab Results  Component Value Date   WBC 11.7 (H) 06/21/2021   HGB 14.3 06/21/2021   HCT 40.4 06/21/2021   MCV 88.0 06/21/2021   PLT 115 (L) 06/21/2021   BMET    Component Value Date/Time   NA 136 06/13/2021 1400   K 3.9 06/13/2021 1400   CL 101 06/13/2021 1400   CO2 28 06/13/2021 1400   GLUCOSE 110 (H) 06/13/2021 1400   BUN 12 06/13/2021 1400   CREATININE 1.12 06/21/2021 1608   CALCIUM 9.5 06/13/2021 1400   GFRNONAA >60 06/21/2021 1608     Assessment/Plan: 2 Days  Post-Op   Active Problems:   S/P lumbar fusion   Advance diet Up with therapy   Patient was cleared by PT/OT. DVT was performed yesterday was negative.   Elevated HR noted this AM - reports pain is mild-mod, has been able to urinate/have a bowel movement, BP WNL . Plan to discharge once HR normalizes. Last recorded HR was around 121, recommended administering HTN Medication and will monitor.      08/21/2021 06/23/2021, 9:16 AM  08/23/2021 PA-C  Physician Assistant with Dr. Dion Saucier [place]

## 2021-06-23 NOTE — Progress Notes (Signed)
NURSING PROGRESS NOTE  Andres West [id number] Discharge Data: 06/23/2021 1:05 PM Attending Provider: Venita Lick, MD RCB:[id number], Yetta Barre [place] to be D/C'd Home per MD order.  Discussed with the patient the After Visit Summary and all questions fully answered. All IV's discontinued with no bleeding noted. All belongings returned to patient for patient to take home.   Last Vital Signs:  Blood pressure (!) 137/94, pulse (!) 122, temperature 99.1 F (37.3 C), temperature source Oral, resp. rate 17, height 6' (1.829 m), weight 92.1 kg, SpO2 100 %.  Discharge Medication List Allergies as of 06/23/2021   No Known Allergies      Medication List     STOP taking these medications    atorvastatin 40 MG tablet Commonly known as: LIPITOR   baclofen 10 MG tablet Commonly known as: LIORESAL       TAKE these medications    enoxaparin 40 MG/0.4ML injection Commonly known as: LOVENOX Inject 0.4 mLs (40 mg total) into the skin daily for 10 days. 10 day supply 1 injection per day   lisinopril 10 MG tablet Commonly known as: ZESTRIL Take 10 mg by mouth daily.   methocarbamol 500 MG tablet Commonly known as: Robaxin Take 1 tablet (500 mg total) by mouth every 8 (eight) hours as needed for up to 5 days for muscle spasms.   OMEPRAZOLE PO Take 1 capsule by mouth daily.   ondansetron 4 MG tablet Commonly known as: Zofran Take 1 tablet (4 mg total) by mouth every 8 (eight) hours as needed for nausea or vomiting.   oxyCODONE 5 MG immediate release tablet Commonly known as: Roxicodone Take 1 tablet (5 mg total) by mouth every 6 (six) hours as needed for up to 5 days for severe pain (1-2 tablets every six hours as needed).               Durable Medical Equipment  (From admission, onward)           Start     Ordered   06/23/21 1255  For home use only DME Walker rolling  Once       Question Answer Comment  Walker: With 5 Inch Wheels   Patient  needs a walker to treat with the following condition S/P lumbar spinal fusion      06/23/21 1255

## 2021-06-26 NOTE — Discharge Summary (Signed)
Patient ID: Andres West MRN: [id number] DOB/AGE: August 28, 1981 39 y.o.  Admit date: 06/21/2021 Discharge date: 06/23/2021  Admission Diagnoses:  Active Problems:   S/P lumbar fusion   Discharge Diagnoses:  Active Problems:   S/P lumbar fusion  status post Procedure(s): ANTERIOR AND POSTERIOR SPINAL FUSION (ALIF L5-S1, POSTERIOR SPINAL FUSION INTERBODY L5-S1) ABDOMINAL EXPOSURE  Past Medical History:  Diagnosis Date   Back spasm    Buttock pain    Degenerative spondylolisthesis    Fatty liver    Foraminal stenosis of lumbar region    Moderate   Hypertension    Low back pain    Neuropathic pain, leg    Intermittent   Other intervertebral disc degeneration, lumbar region    Pre-diabetes     Surgeries: Procedure(s): ANTERIOR AND POSTERIOR SPINAL FUSION (ALIF L5-S1, POSTERIOR SPINAL FUSION INTERBODY L5-S1) ABDOMINAL EXPOSURE on 06/21/2021   Consultants: Treatment Team:  [name], MD  Discharged Condition: Improved  Hospital Course: Andres West is an 40 y.o. male who was admitted 06/21/2021 for operative treatment of Degenerative disc disease L5-S1. Patient failed conservative treatments (please see the history and physical for the specifics) and had severe unremitting pain that affects sleep, daily activities and work/hobbies. After pre-op clearance, the patient was taken to the operating room on 06/21/2021 and underwent  Procedure(s): ANTERIOR AND POSTERIOR SPINAL FUSION (ALIF L5-S1, POSTERIOR SPINAL FUSION INTERBODY L5-S1) ABDOMINAL EXPOSURE.    Patient was given perioperative antibiotics:  Anti-infectives (From admission, onward)    Start     Dose/Rate Route Frequency Ordered Stop   06/21/21 2000  ceFAZolin (ANCEF) IVPB 1 g/50 mL premix        1 g 100 mL/hr over 30 Minutes Intravenous Every 8 hours 06/21/21 1553 06/22/21 0409   06/21/21 0555  ceFAZolin (ANCEF) IVPB 2g/100 mL premix        2 g 200 mL/hr over 30 Minutes Intravenous 30 min pre-op 06/21/21 0555  06/21/21 1150        Patient was given sequential compression devices and early ambulation to prevent DVT.   Patient benefited maximally from hospital stay and there were no complications. At the time of discharge, the patient was urinating/moving their bowels without difficulty, tolerating a regular diet, pain is controlled with oral pain medications and they have been cleared by PT/OT.   Recent vital signs: No data found.   Recent laboratory studies: No results for input(s): WBC, HGB, HCT, PLT, NA, K, CL, CO2, BUN, CREATININE, GLUCOSE, INR, CALCIUM in the last 72 hours.  Invalid input(s): PT, 2   Discharge Medications:   Allergies as of 06/23/2021   No Known Allergies      Medication List     STOP taking these medications    atorvastatin 40 MG tablet Commonly known as: LIPITOR   baclofen 10 MG tablet Commonly known as: LIORESAL       TAKE these medications    enoxaparin 40 MG/0.4ML injection Commonly known as: LOVENOX Inject 0.4 mLs (40 mg total) into the skin daily for 10 days. 10 day supply 1 injection per day   lisinopril 10 MG tablet Commonly known as: ZESTRIL Take 10 mg by mouth daily.   methocarbamol 500 MG tablet Commonly known as: Robaxin Take 1 tablet (500 mg total) by mouth every 8 (eight) hours as needed for up to 5 days for muscle spasms.   OMEPRAZOLE PO Take 1 capsule by mouth daily.   ondansetron 4 MG tablet Commonly known as: Zofran  Take 1 tablet (4 mg total) by mouth every 8 (eight) hours as needed for nausea or vomiting.   oxyCODONE 5 MG immediate release tablet Commonly known as: Roxicodone Take 1 tablet (5 mg total) by mouth every 6 (six) hours as needed for up to 5 days for severe pain (1-2 tablets every six hours as needed).        Diagnostic Studies: DG Lumbar Spine 2-3 Views  Result Date: 06/21/2021 CLINICAL DATA:  Surgical posterior fusion of L5-S1. EXAM: LUMBAR SPINE - 2-3 VIEW; DG C-ARM 1-60 MIN-NO REPORT Radiation exposure  index: 159.64 mGy. COMPARISON:  None. FINDINGS: Three intraoperative fluoroscopic images were obtained of the lower lumbar spine. Patient appears to be status post surgical posterior fusion of L5-S1 with interbody fusion. IMPRESSION: Fluoroscopic guidance provided during lower lumbar surgery. Electronically Signed   By: Lupita Raider M.D.   On: 06/21/2021 14:50   DG C-Arm 1-60 Min-No Report  Result Date: 06/21/2021 Fluoroscopy was utilized by the requesting physician.  No radiographic interpretation.   VAS [place] LOWER EXTREMITY VENOUS (DVT)  Result Date: 06/22/2021  Lower Venous DVT Study Patient Name:  Andres West  Date of Exam:   06/22/2021 Medical Rec #: [id number]       Accession #:    [id number] Date of Birth: 1981-03-04       Patient Gender: M Patient Age:   28 years Exam Location:  [facility] Procedure:      VAS [place] LOWER EXTREMITY VENOUS (DVT) Referring Phys: Debria Garret BROOKS --------------------------------------------------------------------------------  Indications: Post-op.  Risk Factors: Surgery Back SX (ALIF) on 06/21/2021. Comparison Study: No previous exams Performing Technologist: Jody Hill RVT, RDMS  Examination Guidelines: A complete evaluation includes B-mode imaging, spectral Doppler, color Doppler, and power Doppler as needed of all accessible portions of each vessel. Bilateral testing is considered an integral part of a complete examination. Limited examinations for reoccurring indications may be performed as noted. The reflux portion of the exam is performed with the patient in reverse Trendelenburg.  +---------+---------------+---------+-----------+----------+--------------+ RIGHT    CompressibilityPhasicitySpontaneityPropertiesThrombus Aging +---------+---------------+---------+-----------+----------+--------------+ CFV      Full           Yes      Yes                                 +---------+---------------+---------+-----------+----------+--------------+ SFJ       Full                                                        +---------+---------------+---------+-----------+----------+--------------+ FV Prox  Full           Yes      Yes                                 +---------+---------------+---------+-----------+----------+--------------+ FV Mid   Full           Yes      Yes                                 +---------+---------------+---------+-----------+----------+--------------+ FV DistalFull           Yes  Yes                                 +---------+---------------+---------+-----------+----------+--------------+ PFV      Full                                                        +---------+---------------+---------+-----------+----------+--------------+ POP      Full           Yes      Yes                                 +---------+---------------+---------+-----------+----------+--------------+ PTV      Full                                                        +---------+---------------+---------+-----------+----------+--------------+ PERO     Full                                                        +---------+---------------+---------+-----------+----------+--------------+   +---------+---------------+---------+-----------+----------+--------------+ LEFT     CompressibilityPhasicitySpontaneityPropertiesThrombus Aging +---------+---------------+---------+-----------+----------+--------------+ CFV      Full           Yes      Yes                                 +---------+---------------+---------+-----------+----------+--------------+ SFJ      Full                                                        +---------+---------------+---------+-----------+----------+--------------+ FV Prox  Full           Yes      Yes                                 +---------+---------------+---------+-----------+----------+--------------+ FV Mid   Full           Yes      Yes                                  +---------+---------------+---------+-----------+----------+--------------+ FV DistalFull           Yes      Yes                                 +---------+---------------+---------+-----------+----------+--------------+ PFV      Full                                                        +---------+---------------+---------+-----------+----------+--------------+  POP      Full           Yes      Yes                                 +---------+---------------+---------+-----------+----------+--------------+ PTV      Full                                                        +---------+---------------+---------+-----------+----------+--------------+ PERO     Full                                                        +---------+---------------+---------+-----------+----------+--------------+     Summary: BILATERAL: - No evidence of deep vein thrombosis seen in the lower extremities, bilaterally. - No evidence of superficial venous thrombosis in the lower extremities, bilaterally. -No evidence of popliteal cyst, bilaterally.   *See table(s) above for measurements and observations. Electronically signed by Heath Lark on 06/22/2021 at 4:33:48 PM.    Final    DG OR LOCAL ABDOMEN  Result Date: 06/21/2021 CLINICAL DATA:  L5-S1 ALIF.  Instrument count. EXAM: OR LOCAL ABDOMEN COMPARISON:  None. FINDINGS: No radiopaque foreign body identified. L5-S1 ALIF hardware in place. The bowel gas pattern is normal. No radio-opaque calculi or other significant radiographic abnormality are seen. IMPRESSION: 1. No radiopaque foreign body identified. These results were called by telephone at the time of interpretation on 06/21/2021 at 11:22 am to provider [facility], who verbally acknowledged these results. Electronically Signed   By: Obie Dredge M.D.   On: 06/21/2021 11:23    Discharge Instructions     Call MD / Call 911   Complete by: As directed    If you experience  chest pain or shortness of breath, CALL 911 and be transported to the hospital emergency room.  If you develope a fever above 101 F, pus (white drainage) or increased drainage or redness at the wound, or calf pain, call your surgeon's office.   Constipation Prevention   Complete by: As directed    Drink plenty of fluids.  Prune juice may be helpful.  You may use a stool softener, such as Colace (over the counter) 100 mg twice a day.  Use MiraLax (over the counter) for constipation as needed.   Diet - low sodium heart healthy   Complete by: As directed    Incentive spirometry RT   Complete by: As directed    Increase activity slowly as tolerated   Complete by: As directed    Post-operative opioid taper instructions:   Complete by: As directed    POST-OPERATIVE OPIOID TAPER INSTRUCTIONS: It is important to wean off of your opioid medication as soon as possible. If you do not need pain medication after your surgery it is ok to stop day one. Opioids include: Codeine, Hydrocodone(Norco, Vicodin), Oxycodone(Percocet, oxycontin) and hydromorphone amongst others.  Long term and even short term use of opiods can cause: Increased pain response Dependence Constipation Depression Respiratory depression And more.  Withdrawal symptoms can include Flu like symptoms Nausea, vomiting And more Techniques to  manage these symptoms Hydrate well Eat regular healthy meals Stay active Use relaxation techniques(deep breathing, meditating, yoga) Do Not substitute Alcohol to help with tapering If you have been on opioids for less than two weeks and do not have pain than it is ok to stop all together.  Plan to wean off of opioids This plan should start within one week post op of your joint replacement. Maintain the same interval or time between taking each dose and first decrease the dose.  Cut the total daily intake of opioids by one tablet each day Next start to increase the time between doses. The last  dose that should be eliminated is the evening dose.           Follow-up Information     Venita Lick, MD Follow up in 2 week(s).   Specialty: Orthopedic Surgery Why: If symptoms worsen, For wound re-check, For suture removal Contact information: [address] [place] [address] [phone]                 Discharge Plan:  discharge to home  Disposition: stable    Signed: Rhodia Albright for [facility] PA-C [facility] [phone] 06/26/2021, 8:43 AM

## 2021-06-27 ENCOUNTER — Encounter (HOSPITAL_COMMUNITY): Payer: Self-pay | Admitting: Orthopedic Surgery

## 2021-09-25 DIAGNOSIS — J209 Acute bronchitis, unspecified: Secondary | ICD-10-CM | POA: Diagnosis not present

## 2021-09-25 DIAGNOSIS — J0100 Acute maxillary sinusitis, unspecified: Secondary | ICD-10-CM | POA: Diagnosis not present

## 2022-05-05 IMAGING — CR DG OR LOCAL ABDOMEN
1 series · 1 of 1 positions shown · non-contrast
Comparison: None.

CLINICAL DATA: L5-S1 ALIF.  Instrument count.

EXAM:
OR LOCAL ABDOMEN

[AP]
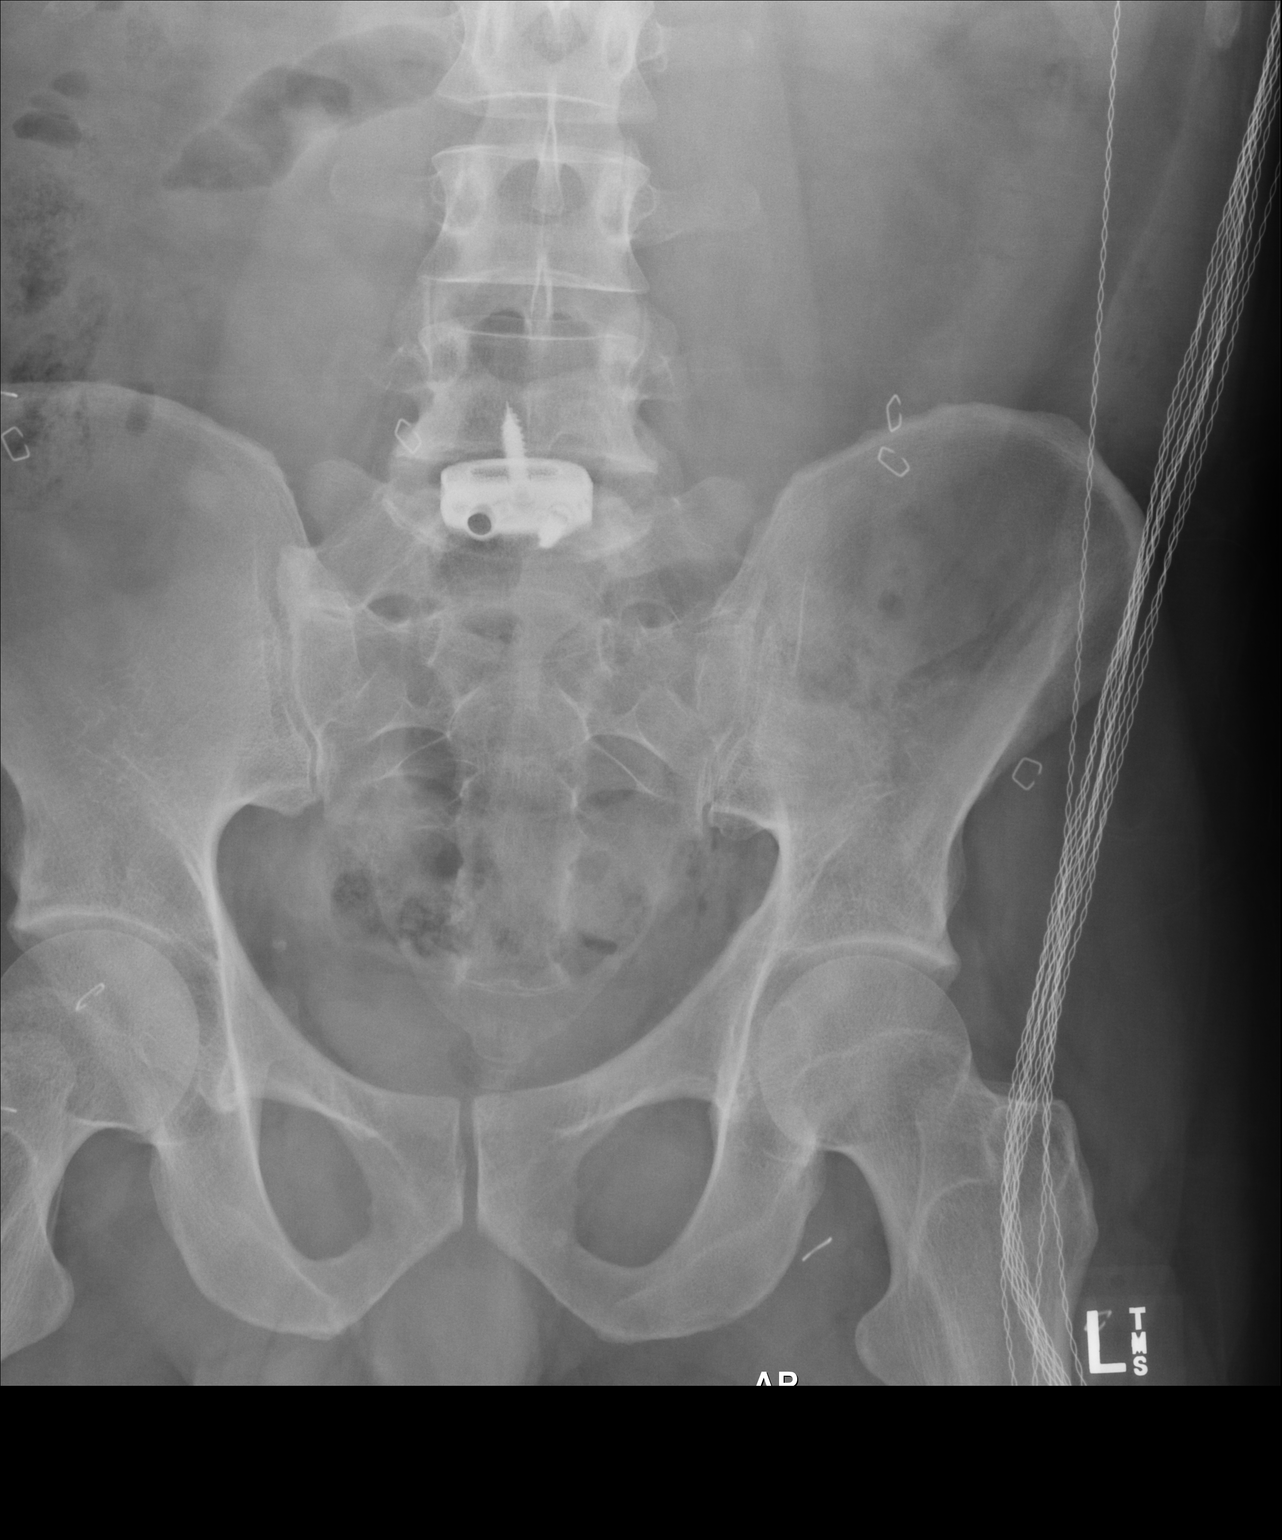

[1 of 1 positions shown; findings below may reference images not displayed]

FINDINGS: No radiopaque foreign body identified. L5-S1 ALIF hardware in place.
The bowel gas pattern is normal. No radio-opaque calculi or other
significant radiographic abnormality are seen.
IMPRESSION: 1. No radiopaque foreign body identified.

These results were called by telephone at the time of interpretation
on 06/21/2021 at [DATE] to provider ALICE HELEEN SONHO, who verbally
acknowledged these results.

## 2022-05-05 IMAGING — RF DG LUMBAR SPINE 2-3V
1 series · 3 of 3 positions shown · non-contrast
Comparison: None.

CLINICAL DATA: Surgical posterior fusion of L5-S1.

EXAM:
LUMBAR SPINE - 2-3 VIEW; DG C-ARM 1-60 MIN-NO REPORT
Radiation exposure index: 159.64 mGy.

[Series 1: run · 3 of 3 slices shown]
[im 1/3]
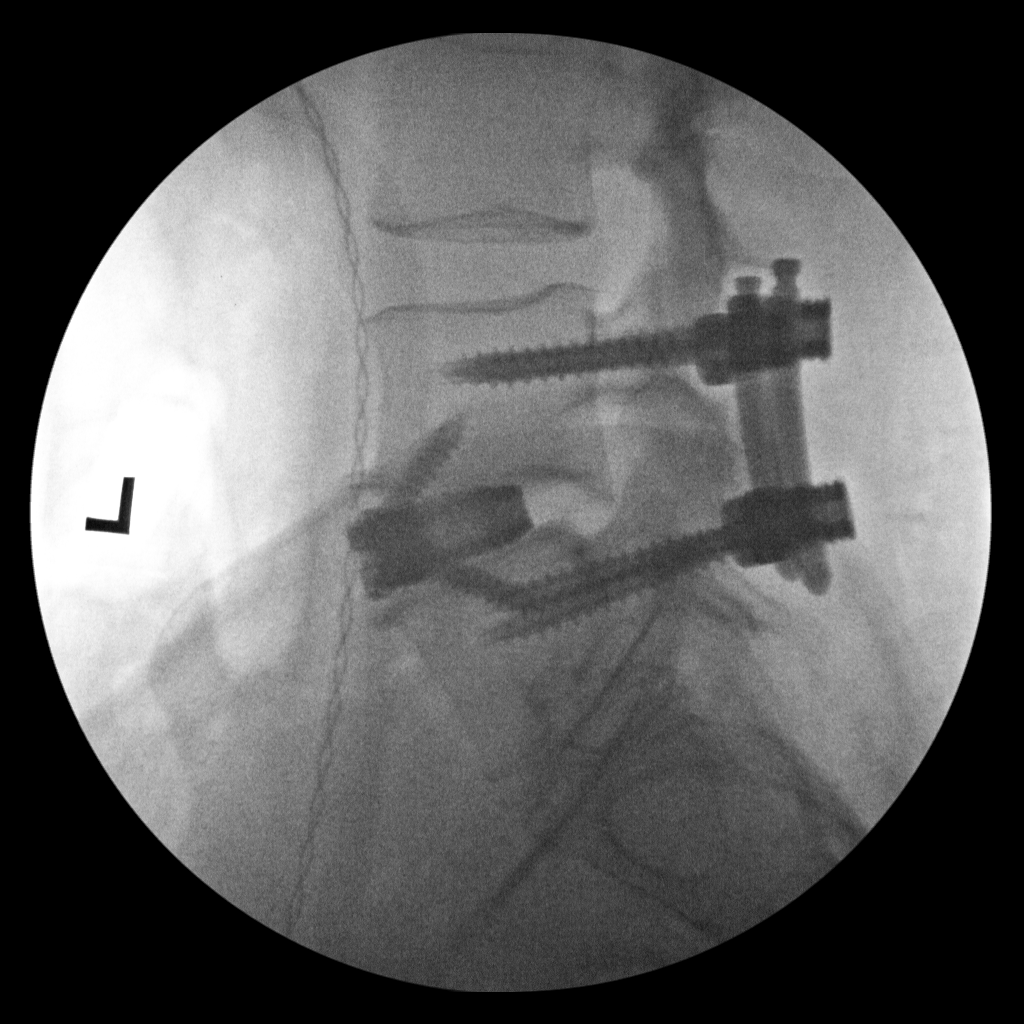
[im 2/3]
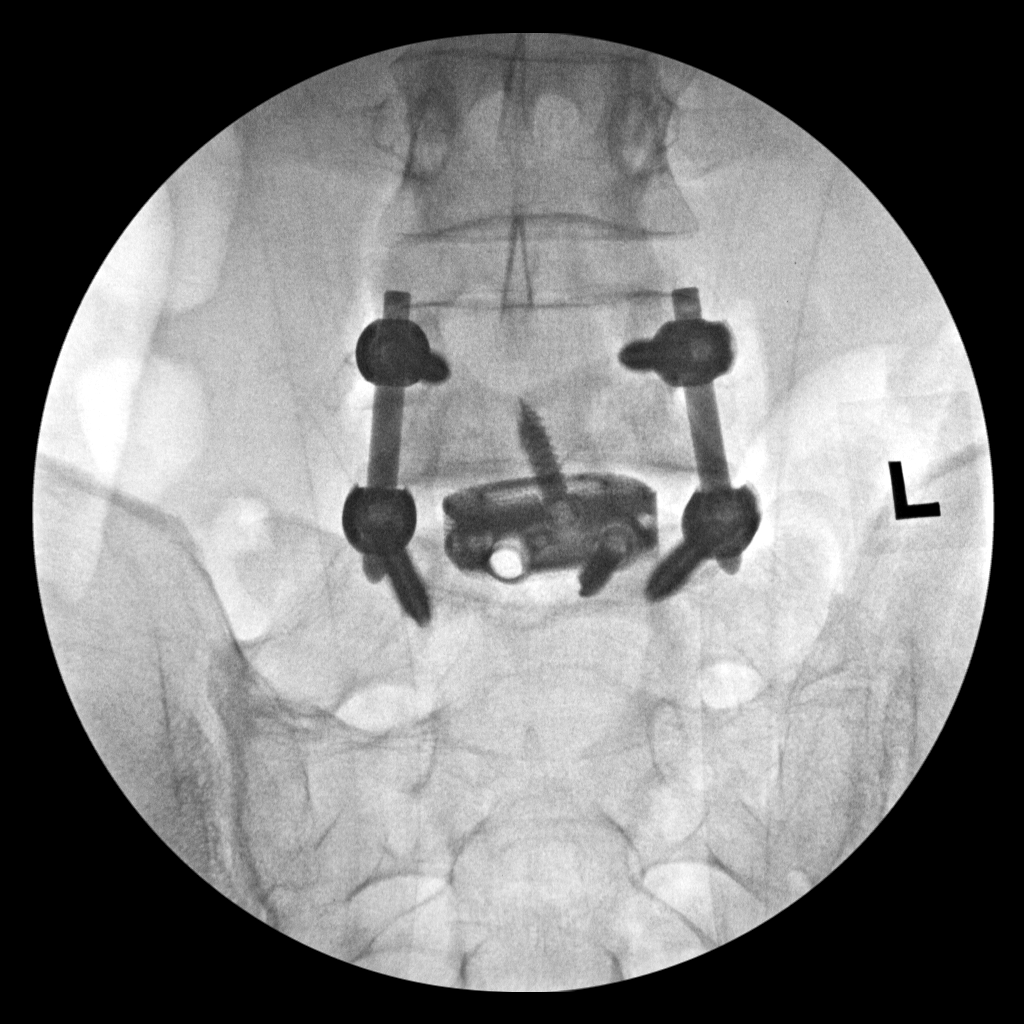
[im 3/3]
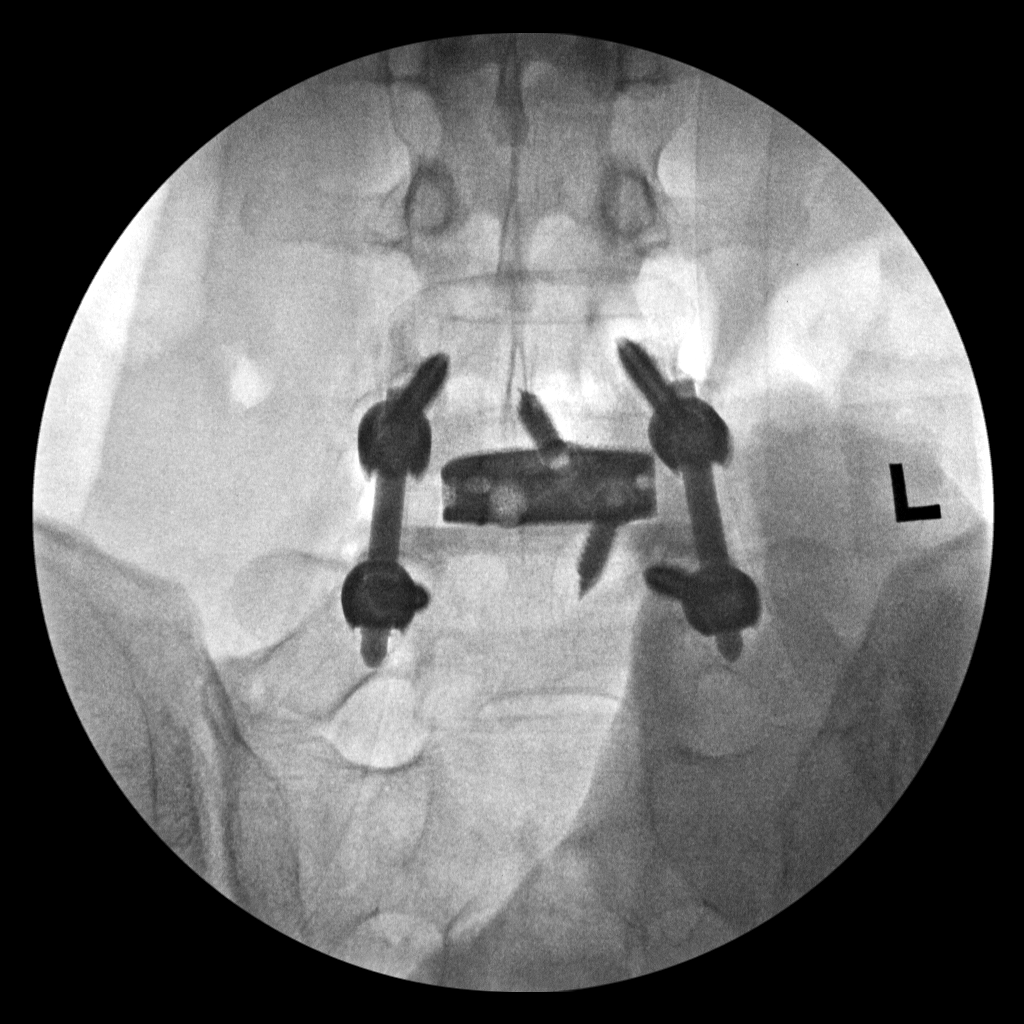

[3 of 3 positions shown; findings below may reference images not displayed]

FINDINGS: Three intraoperative fluoroscopic images were obtained of the lower
lumbar spine. Patient appears to be status post surgical posterior
fusion of L5-S1 with interbody fusion.
IMPRESSION: Fluoroscopic guidance provided during lower lumbar surgery.
